# Patient Record
Sex: Male | Born: 1950 | Race: Black or African American | Hispanic: No | Marital: Married | Smoking: Never smoker
Health system: Southern US, Community
[De-identification: ages and names within clinical notes are randomized; demographics above are authoritative.]

## PROBLEM LIST (undated history)

## (undated) DIAGNOSIS — C801 Malignant (primary) neoplasm, unspecified: Secondary | ICD-10-CM

## (undated) DIAGNOSIS — K649 Unspecified hemorrhoids: Secondary | ICD-10-CM

## (undated) DIAGNOSIS — N201 Calculus of ureter: Secondary | ICD-10-CM

## (undated) DIAGNOSIS — I2510 Atherosclerotic heart disease of native coronary artery without angina pectoris: Secondary | ICD-10-CM

## (undated) DIAGNOSIS — K219 Gastro-esophageal reflux disease without esophagitis: Secondary | ICD-10-CM

## (undated) DIAGNOSIS — D649 Anemia, unspecified: Secondary | ICD-10-CM

## (undated) DIAGNOSIS — Z9861 Coronary angioplasty status: Secondary | ICD-10-CM

## (undated) DIAGNOSIS — R9439 Abnormal result of other cardiovascular function study: Secondary | ICD-10-CM

## (undated) DIAGNOSIS — E785 Hyperlipidemia, unspecified: Secondary | ICD-10-CM

## (undated) DIAGNOSIS — I10 Essential (primary) hypertension: Secondary | ICD-10-CM

## (undated) HISTORY — DX: Gastro-esophageal reflux disease without esophagitis: K21.9

## (undated) HISTORY — PX: CORONARY ANGIOPLASTY WITH STENT PLACEMENT: SHX49

## (undated) HISTORY — DX: Hypomagnesemia: E83.42

## (undated) HISTORY — DX: Coronary angioplasty status: Z98.61

## (undated) HISTORY — DX: Anemia, unspecified: D64.9

## (undated) HISTORY — DX: Calculus of ureter: N20.1

## (undated) HISTORY — DX: Unspecified hemorrhoids: K64.9

## (undated) HISTORY — DX: Abnormal result of other cardiovascular function study: R94.39

## (undated) HISTORY — DX: Hyperlipidemia, unspecified: E78.5

---

## 2012-09-29 DIAGNOSIS — Z9189 Other specified personal risk factors, not elsewhere classified: Secondary | ICD-10-CM | POA: Insufficient documentation

## 2012-09-29 DIAGNOSIS — Z9861 Coronary angioplasty status: Secondary | ICD-10-CM

## 2012-09-29 DIAGNOSIS — E785 Hyperlipidemia, unspecified: Secondary | ICD-10-CM

## 2012-09-29 DIAGNOSIS — R9430 Abnormal result of cardiovascular function study, unspecified: Secondary | ICD-10-CM | POA: Insufficient documentation

## 2012-09-29 HISTORY — DX: Coronary angioplasty status: Z98.61

## 2012-09-29 HISTORY — DX: Hyperlipidemia, unspecified: E78.5

## 2015-10-02 DIAGNOSIS — R7301 Impaired fasting glucose: Secondary | ICD-10-CM | POA: Insufficient documentation

## 2017-08-11 DIAGNOSIS — I10 Essential (primary) hypertension: Secondary | ICD-10-CM | POA: Insufficient documentation

## 2017-12-29 ENCOUNTER — Emergency Department: Payer: Medicare Other

## 2017-12-29 ENCOUNTER — Emergency Department
Admission: EM | Admit: 2017-12-29 | Discharge: 2017-12-29 | Disposition: A | Discharge status: Home / Self Care | Payer: Medicare Other | Attending: Emergency Medicine | Admitting: Emergency Medicine

## 2017-12-29 ENCOUNTER — Other Ambulatory Visit: Payer: Self-pay

## 2017-12-29 DIAGNOSIS — R1031 Right lower quadrant pain: Secondary | ICD-10-CM | POA: Insufficient documentation

## 2017-12-29 DIAGNOSIS — Z955 Presence of coronary angioplasty implant and graft: Secondary | ICD-10-CM | POA: Insufficient documentation

## 2017-12-29 DIAGNOSIS — N200 Calculus of kidney: Secondary | ICD-10-CM | POA: Diagnosis not present

## 2017-12-29 DIAGNOSIS — N2889 Other specified disorders of kidney and ureter: Secondary | ICD-10-CM | POA: Diagnosis not present

## 2017-12-29 DIAGNOSIS — I2510 Atherosclerotic heart disease of native coronary artery without angina pectoris: Secondary | ICD-10-CM | POA: Insufficient documentation

## 2017-12-29 DIAGNOSIS — R109 Unspecified abdominal pain: Secondary | ICD-10-CM | POA: Diagnosis present

## 2017-12-29 HISTORY — DX: Atherosclerotic heart disease of native coronary artery without angina pectoris: I25.10

## 2017-12-29 LAB — COMPREHENSIVE METABOLIC PANEL
ALK PHOS: 56 U/L (ref 38–126)
ALT: 25 U/L (ref 0–44)
ANION GAP: 8 (ref 5–15)
AST: 33 U/L (ref 15–41)
Albumin: 4.6 g/dL (ref 3.5–5.0)
BUN: 17 mg/dL (ref 8–23)
CALCIUM: 9.5 mg/dL (ref 8.9–10.3)
CO2: 26 mmol/L (ref 22–32)
CREATININE: 1.33 mg/dL — AB (ref 0.61–1.24)
Chloride: 106 mmol/L (ref 98–111)
GFR calc Af Amer: >60 mL/min (ref >60)
GFR, EST NON AFRICAN AMERICAN: 54 mL/min — AB (ref >60)
Glucose, Bld: 148 mg/dL — ABNORMAL HIGH (ref 70–99)
Potassium: 4.4 mmol/L (ref 3.5–5.1)
SODIUM: 140 mmol/L (ref 135–145)
TOTAL PROTEIN: 7.6 g/dL (ref 6.5–8.1)
Total Bilirubin: 1.1 mg/dL (ref 0.3–1.2)

## 2017-12-29 LAB — URINALYSIS, COMPLETE (UACMP) WITH MICROSCOPIC
BILIRUBIN URINE: NEGATIVE
Bacteria, UA: NONE SEEN
GLUCOSE, UA: NEGATIVE mg/dL
Ketones, ur: NEGATIVE mg/dL
LEUKOCYTES UA: NEGATIVE
NITRITE: NEGATIVE
PROTEIN: 30 mg/dL — AB
Specific Gravity, Urine: 1.017 (ref 1.005–1.030)
Squamous Epithelial / LPF: NONE SEEN (ref 0–5)
pH: 7 (ref 5.0–8.0)

## 2017-12-29 LAB — CBC
HCT: 40.7 % (ref 40.0–52.0)
HEMOGLOBIN: 13.6 g/dL (ref 13.0–18.0)
MCH: 30.3 pg (ref 26.0–34.0)
MCHC: 33.5 g/dL (ref 32.0–36.0)
MCV: 90.6 fL (ref 80.0–100.0)
PLATELETS: 229 10*3/uL (ref 150–440)
RBC: 4.49 MIL/uL (ref 4.40–5.90)
RDW: 13.9 % (ref 11.5–14.5)
WBC: 9.1 10*3/uL (ref 3.8–10.6)

## 2017-12-29 LAB — LIPASE, BLOOD: Lipase: 30 U/L (ref 11–51)

## 2017-12-29 MED ORDER — ONDANSETRON HCL 4 MG/2ML IJ SOLN
4.0000 mg | Freq: Once | INTRAMUSCULAR | Status: AC
  Administered 2017-12-29: 4 mg via INTRAVENOUS

## 2017-12-29 MED ORDER — SODIUM CHLORIDE 0.9 % IV BOLUS
1000.0000 mL | Freq: Once | INTRAVENOUS | Status: AC
  Administered 2017-12-29: 1000 mL via INTRAVENOUS

## 2017-12-29 MED ORDER — TAMSULOSIN HCL 0.4 MG PO CAPS: 0.4000 mg | ORAL_CAPSULE | Freq: Every day | ORAL | 0 refills | Status: DC

## 2017-12-29 MED ORDER — OXYCODONE-ACETAMINOPHEN 5-325 MG PO TABS: 1.0000 | ORAL_TABLET | ORAL | 0 refills | Status: DC | PRN

## 2017-12-29 MED ORDER — MORPHINE SULFATE (PF) 4 MG/ML IV SOLN
INTRAVENOUS | Status: AC
  Filled 2017-12-29: qty 1

## 2017-12-29 MED ORDER — ONDANSETRON HCL 4 MG/2ML IJ SOLN
INTRAMUSCULAR | Status: AC
  Filled 2017-12-29: qty 2

## 2017-12-29 MED ORDER — MORPHINE SULFATE (PF) 4 MG/ML IV SOLN
4.0000 mg | Freq: Once | INTRAVENOUS | Status: AC
  Administered 2017-12-29: 4 mg via INTRAVENOUS

## 2017-12-29 NOTE — ED Provider Notes (Signed)
[facility] Emergency Department Provider Note ____________________________________________   First MD Initiated Contact with Patient 12/29/17 1515     (approximate)  I have reviewed the triage vital signs and the nursing notes.   HISTORY  Chief Complaint Abdominal Pain  HPI Tanner Jackson is a 67 y.o. male history of coronary artery disease as well as kidney stones in the 90s who was presented to the emergency department with sudden onset right flank pain that started at about 10 AM.  He says the pain is sharp and a 10 out of 10.  No nausea or vomiting.  Says the pain is now constant.  No burning with urination or blood in his urine.  Says this feels similar to previous kidney stone.  Says that he feels the pain is somewhat radiating around to his right lower quadrant.  Past Medical History:  Diagnosis Date  . Coronary artery disease     There are no active problems to display for this patient.   Past Surgical History:  Procedure Laterality Date  . CORONARY ANGIOPLASTY WITH STENT PLACEMENT      Prior to Admission medications   Not on File    Allergies Patient has no known allergies.  No family history on file.  Social History Social History   Tobacco Use  . Smoking status: Never Smoker  . Smokeless tobacco: Never Used  Substance Use Topics  . Alcohol use: Yes    Comment: occ  . Drug use: Never    Review of Systems  Constitutional: No fever/chills Eyes: No visual changes. ENT: No sore throat. Cardiovascular: Denies chest pain. Respiratory: Denies shortness of breath. Gastrointestinal:   No nausea, no vomiting.  No diarrhea.  No constipation. Genitourinary: Negative for dysuria. Musculoskeletal: As above Skin: Negative for rash. Neurological: Negative for headaches, focal weakness or numbness.   ____________________________________________   PHYSICAL EXAM:  VITAL SIGNS: ED Triage Vitals  Enc Vitals Group     BP 12/29/17  1434 (!) 141/80     Pulse Rate 12/29/17 1434 70     Resp 12/29/17 1434 16     Temp 12/29/17 1434 98.4 F (36.9 C)     Temp Source 12/29/17 1434 Oral     SpO2 12/29/17 1434 98 %     Weight 12/29/17 1432 198 lb (89.8 kg)     Height 12/29/17 1432 5\' 6"  (1.676 m)     Head Circumference --      Peak Flow --      Pain Score 12/29/17 1431 8     Pain Loc --      Pain Edu? --      Excl. in [place]? --     Constitutional: Alert and oriented.  Patient appears uncomfortable Eyes: Conjunctivae are normal.  Head: Atraumatic. Nose: No congestion/rhinnorhea. Mouth/Throat: Mucous membranes are moist.  Neck: No stridor.   Cardiovascular: Normal rate, regular rhythm. Grossly normal heart sounds.   Respiratory: Normal respiratory effort.  No retractions. Lungs CTAB. Gastrointestinal: Soft and nontender. No distention.  Right-sided CVA tenderness to palpation. Musculoskeletal: No lower extremity tenderness nor edema.  No joint effusions. Neurologic:  Normal speech and language. No gross focal neurologic deficits are appreciated. Skin:  Skin is warm, dry and intact. No rash noted. Psychiatric: Mood and affect are normal. Speech and behavior are normal.  ____________________________________________   LABS (all labs ordered are listed, but only abnormal results are displayed)  Labs Reviewed  COMPREHENSIVE METABOLIC PANEL - Abnormal; Notable for the following  components:      Result Value   Glucose, Bld 148 (*)    Creatinine, Ser 1.33 (*)    GFR calc non Af Amer 54 (*)    All other components within normal limits  URINALYSIS, COMPLETE (UACMP) WITH MICROSCOPIC - Abnormal; Notable for the following components:   Color, Urine YELLOW (*)    APPearance CLOUDY (*)    Hgb urine dipstick LARGE (*)    Protein, ur 30 (*)    RBC / HPF >50 (*)    All other components within normal limits  LIPASE, BLOOD  CBC    ____________________________________________  EKG   ____________________________________________  RADIOLOGY  5 mm calculus in the proximal right ureter with resultant mild right hydronephrosis.  3.2 cm exophytic mixed density lesion arising from the midpole of the left kidney with areas of peripheral hemorrhage.  Recommend CT of renal protocol or MRI. ____________________________________________   PROCEDURES  Procedure(s) performed:   Procedures  Critical Care performed:   ____________________________________________   INITIAL IMPRESSION / ASSESSMENT AND PLAN / ED COURSE  Pertinent labs & imaging results that were available during my care of the patient were reviewed by me and considered in my medical decision making (see chart for details).  Differential diagnosis includes, but is not limited to, acute appendicitis, renal colic, testicular torsion, urinary tract infection/pyelonephritis, prostatitis,  epididymitis, diverticulitis, small bowel obstruction or ileus, colitis, abdominal aortic aneurysm, gastroenteritis, hernia, etc. As part of my medical decision making, I reviewed the following data within the [place] outpatient records  ----------------------------------------- 5:33 PM on 12/29/2017 -----------------------------------------  Patient at this time is pain-free.  We discussed the diagnosis of the right-sided kidney stone as well as a left-sided kidney mass the need for follow-up and further imaging.  The patient will be following up with urology.  I will give him the follow-up number for Dr. Bernardo Heater who is on-call.  We discussed that the mass will be very important to follow-up with as it is possible this could be a cancer.  As far as the kidney stone, the patient understands to drink plenty fluids.  He will be given Flomax as well as Percocet.  The stone is proximal and he understands return immediately for any worsening or concerning  symptoms especially abdominal pain, flank pain, fever or nausea and vomiting. ____________________________________________   FINAL CLINICAL IMPRESSION(S) / ED DIAGNOSES  Kidney stone.  Kidney mass.  NEW MEDICATIONS STARTED DURING THIS VISIT:  New Prescriptions   No medications on file     Note:  This document was prepared using Dragon voice recognition software and may include unintentional dictation errors.     Orbie Pyo, MD 12/29/17 [phone]

## 2017-12-29 NOTE — ED Notes (Signed)
Pt to the er for pain to the right side back and radiates around to the right side abdomen and into the midline. Pt reported increased pain on the ride over. Hx of kidney stone. Pt is pacing in room. Urine is dark in color. Wife is at bedside.

## 2017-12-29 NOTE — ED Notes (Signed)
Patient transported to CT 

## 2017-12-29 NOTE — ED Triage Notes (Signed)
Pt c/o abd pain in right lower back that radiates into right lower abd - pain has been present for 5 hours - denies N/V/D - denies pain/frequency/burning with urination

## 2018-01-05 ENCOUNTER — Encounter: Payer: Self-pay | Admitting: Urology

## 2018-01-05 ENCOUNTER — Ambulatory Visit: Payer: Medicare Other | Admitting: Urology

## 2018-01-05 VITALS — BP 159/95 | HR 80 | Ht 66.0 in | Wt 198.0 lb

## 2018-01-05 DIAGNOSIS — K649 Unspecified hemorrhoids: Secondary | ICD-10-CM

## 2018-01-05 DIAGNOSIS — N201 Calculus of ureter: Secondary | ICD-10-CM | POA: Diagnosis not present

## 2018-01-05 DIAGNOSIS — R9439 Abnormal result of other cardiovascular function study: Secondary | ICD-10-CM

## 2018-01-05 DIAGNOSIS — I2510 Atherosclerotic heart disease of native coronary artery without angina pectoris: Secondary | ICD-10-CM | POA: Insufficient documentation

## 2018-01-05 DIAGNOSIS — N2889 Other specified disorders of kidney and ureter: Secondary | ICD-10-CM | POA: Diagnosis not present

## 2018-01-05 DIAGNOSIS — Z9861 Coronary angioplasty status: Secondary | ICD-10-CM

## 2018-01-05 DIAGNOSIS — K219 Gastro-esophageal reflux disease without esophagitis: Secondary | ICD-10-CM

## 2018-01-05 DIAGNOSIS — D649 Anemia, unspecified: Secondary | ICD-10-CM

## 2018-01-05 DIAGNOSIS — Z9289 Personal history of other medical treatment: Secondary | ICD-10-CM | POA: Insufficient documentation

## 2018-01-05 HISTORY — DX: Coronary angioplasty status: Z98.61

## 2018-01-05 HISTORY — DX: Calculus of ureter: N20.1

## 2018-01-05 HISTORY — DX: Gastro-esophageal reflux disease without esophagitis: K21.9

## 2018-01-05 HISTORY — DX: Abnormal result of other cardiovascular function study: R94.39

## 2018-01-05 HISTORY — DX: Anemia, unspecified: D64.9

## 2018-01-05 HISTORY — DX: Hypomagnesemia: E83.42

## 2018-01-05 HISTORY — DX: Unspecified hemorrhoids: K64.9

## 2018-01-05 LAB — MICROSCOPIC EXAMINATION
EPITHELIAL CELLS (NON RENAL): NONE SEEN /HPF (ref 0–10)
RBC, UA: NONE SEEN /hpf (ref 0–2)

## 2018-01-05 LAB — URINALYSIS, COMPLETE
BILIRUBIN UA: NEGATIVE
Glucose, UA: NEGATIVE
Ketones, UA: NEGATIVE
Leukocytes, UA: NEGATIVE
Nitrite, UA: NEGATIVE
Protein, UA: NEGATIVE
RBC, UA: NEGATIVE
Specific Gravity, UA: 1.025 (ref 1.005–1.030)
UUROB: 0.2 mg/dL (ref 0.2–1.0)
pH, UA: 5 (ref 5.0–7.5)

## 2018-01-05 NOTE — Progress Notes (Signed)
01/05/2018 10:01 AM   Tanner Jackson [birth year] [id number]  Referring provider: Wilhelmina Mcardle, MD [address] [place], [place] [address]  Chief Complaint  Patient presents with  . Nephrolithiasis    New Patient  . Renal Mass    HPI: 67 year old male referred for further evaluation of a ureteral stone as well as possible renal mass.  He was seen and evaluated in the emergency room on 12/29/2017 with acute onset severe right flank pain.  His creatinine was mildly elevated to 1.33 (baseline 1.1) otherwise, no evidence of infection.    He underwent CT stone protocol showing a 5 mm right proximal ureteral calculus with mild hydronephrosis.  In addition to this, there is an incidental 3.2 exophytic mixed density renal lesion arising from the left midpole.  This is incompletely characterized due to lack of contrast.  No previous imaging for comparison.  He subsequently passed the stone just yesterday and brings the sample with him today.  Stone is consistent with size and shape of stone on CT scan.  He denies a personal history of kidney stones.  He denies any ongoing flank pain or gross hematuria.  No weight loss.  No family history of GU malignancies.    PMH: Past Medical History:  Diagnosis Date  . Abnormal nuclear stress test 01/05/2018   6/13:DRH:distal lad ischemia, ef normal  . Anemia 01/05/2018  . Coronary artery disease   . GERD (gastroesophageal reflux disease) 01/05/2018  . Hemorrhoids 01/05/2018  . History of PTCA 01/05/2018   6/13:DES to LAD.3.5 x 33mm Expedition. RCA dominant mild to moderate. Circ LM ok.  . Hyperlipidemia 09/29/2012  . Hypomagnesemia 01/05/2018  . Right ureteral stone 01/05/2018  . Status post percutaneous transluminal coronary angioplasty 09/29/2012   6/13:DES to LAD.3.5 x 44mm Expedition. RCA dominant mild to moderate. Circ LM ok.    Surgical History: Past Surgical History:  Procedure Laterality Date  . CORONARY ANGIOPLASTY WITH STENT  PLACEMENT      Home Medications:  Allergies as of 01/05/2018   No Known Allergies     Medication List        Accurate as of 01/05/18 10:01 AM. Always use your most recent med list.          aspirin EC 81 MG tablet Take by mouth.   atorvastatin 10 MG tablet Commonly known as:  LIPITOR Take 10 mg by mouth daily.   oxyCODONE-acetaminophen 5-325 MG tablet Commonly known as:  PERCOCET Take 1 tablet by mouth every 4 (four) hours as needed for moderate pain or severe pain.   tamsulosin 0.4 MG Caps capsule Commonly known as:  FLOMAX Take 1 capsule (0.4 mg total) by mouth daily.       Allergies: No Known Allergies  Family History: Family History  Problem Relation Age of Onset  . Kidney failure Mother     Social History:  reports that he has never smoked. He has never used smokeless tobacco. He reports that he drinks alcohol. He reports that he does not use drugs.  ROS: UROLOGY Frequent Urination?: No Hard to postpone urination?: No Burning/pain with urination?: No Get up at night to urinate?: No Leakage of urine?: No Urine stream starts and stops?: No Trouble starting stream?: No Do you have to strain to urinate?: No Blood in urine?: No Urinary tract infection?: No Sexually transmitted disease?: No Injury to kidneys or bladder?: No Painful intercourse?: No Weak stream?: No Erection problems?: No Penile pain?: No  Gastrointestinal Nausea?: No Vomiting?:  No Indigestion/heartburn?: No Diarrhea?: No Constipation?: No  Constitutional Fever: No Night sweats?: No Weight loss?: No Fatigue?: No  Skin Skin rash/lesions?: No Itching?: No  Eyes Blurred vision?: No Double vision?: No  Ears/Nose/Throat Sore throat?: No Sinus problems?: No  Hematologic/Lymphatic Swollen glands?: No Easy bruising?: No  Cardiovascular Leg swelling?: No Chest pain?: No  Respiratory Cough?: No Shortness of breath?: No  Endocrine Excessive thirst?:  No  Musculoskeletal Back pain?: No Joint pain?: No  Neurological Headaches?: No Dizziness?: No  Psychologic Depression?: No Anxiety?: No  Physical Exam: BP (!) 159/95   Pulse 80   Ht 5\' 6"  (1.676 m)   Wt 198 lb (89.8 kg)   BMI 31.96 kg/m   Constitutional:  Alert and oriented, No acute distress.  Accompanied by wife today. HEENT: [place] AT, moist mucus membranes.  Trachea midline, no masses. Cardiovascular: No clubbing, cyanosis, or edema. Respiratory: Normal respiratory effort, no increased work of breathing. GI: Abdomen is soft, nontender, nondistended, no abdominal masses GU: No CVA tenderness Skin: No rashes, bruises or suspicious lesions. Neurologic: Grossly intact, no focal deficits, moving all 4 extremities. Psychiatric: Normal mood and affect.  Laboratory Data: Lab Results  Component Value Date   WBC 9.1 12/29/2017   HGB 13.6 12/29/2017   HCT 40.7 12/29/2017   MCV 90.6 12/29/2017   PLT 229 12/29/2017    Lab Results  Component Value Date   CREATININE 1.33 (H) 12/29/2017     Urinalysis Results for orders placed or performed in visit on 01/05/18  Microscopic Examination  Result Value Ref Range   WBC, UA 0-5 0 - 5 /hpf   RBC, UA None seen 0 - 2 /hpf   Epithelial Cells (non renal) None seen 0 - 10 /hpf   Casts Present (A) None seen /lpf   Cast Type Hyaline casts N/A   Mucus, UA Present (A) Not Estab.   Bacteria, UA Few (A) None seen/Few  Urinalysis, Complete  Result Value Ref Range   Specific Gravity, UA 1.025 1.005 - 1.030   pH, UA 5.0 5.0 - 7.5   Color, UA Yellow Yellow   Appearance Ur Clear Clear   Leukocytes, UA Negative Negative   Protein, UA Negative Negative/Trace   Glucose, UA Negative Negative   Ketones, UA Negative Negative   RBC, UA Negative Negative   Bilirubin, UA Negative Negative   Urobilinogen, Ur 0.2 0.2 - 1.0 mg/dL   Nitrite, UA Negative Negative   Microscopic Examination See below:     Pertinent Imaging:. Results for orders  placed during the hospital encounter of 12/29/17  CT RENAL STONE STUDY   Narrative CLINICAL DATA:  Right flank pain.  EXAM: CT ABDOMEN AND PELVIS WITHOUT CONTRAST  TECHNIQUE: Multidetector CT imaging of the abdomen and pelvis was performed following the standard protocol without IV contrast.  COMPARISON:  None.  FINDINGS: Lower chest: No acute abnormality.  Hepatobiliary: No focal liver abnormality. Calcified granuloma in the central liver consistent with prior granulomatous disease. The gallbladder is unremarkable. No biliary dilatation.  Pancreas: Unremarkable. No pancreatic ductal dilatation or surrounding inflammatory changes.  Spleen: Normal in size without focal abnormality.  Adrenals/Urinary Tract: The adrenal glands are unremarkable. Punctate right renal calculi. 5 mm calculus in the proximal right ureter with resultant mild right hydronephrosis. 3.2 cm exophytic mixed density lesion arising from the midpole of the left kidney. There are areas of peripheral hyperdensity suggesting hemorrhage or proteinaceous contents. The bladder is unremarkable for the degree of distention.  Stomach/Bowel: Stomach is  within normal limits. Tiny appendicoliths. Appendix otherwise appears normal. No evidence of bowel wall thickening, distention, or inflammatory changes.  Vascular/Lymphatic: Minimal aortoiliac atherosclerosis. No enlarged abdominal or pelvic lymph nodes.  Reproductive: Prostatomegaly.  Other: No abdominal wall hernia or abnormality. No abdominopelvic ascites. No pneumoperitoneum.  Musculoskeletal: No acute or significant osseous findings.  IMPRESSION: 1. 5 mm calculus in the proximal right ureter with resultant mild right hydronephrosis. 2. Additional punctate nonobstructive right nephrolithiasis. 3. 3.2 cm exophytic mixed density lesion arising from the midpole of the left kidney with areas of peripheral hemorrhage. Non-emergent renal protocol CT or MRI with  and without contrast is recommended for further evaluation. This recommendation follows ACR consensus guidelines: Management of the Incidental Renal Mass on CT: A White Paper of the ACR Incidental Findings Committee. J Am Coll Radiol [phone].   Electronically Signed   By: Titus Dubin M.D.   On: 12/29/2017 16:25    CT scan personally reviewed today and with the patient and his wife.     Assessment & Plan:    1. Right ureteral stone S/p interval passage with expulsive therapy Stone stent today for stone analysis- will call with results We discussed general stone prevention techniques including drinking plenty water with goal of producing 2.5 L urine daily, increased citric acid intake, avoidance of high oxalate containing foods, and decreased salt intake.  Information about dietary recommendations given today.  - Urinalysis, Complete  2. Renal mass, left Incidental probable left renal mass on noncontrast scan Lesion is incompletely characterized I have recommended pursuing renal MRI with and without contrast for further evaluation ddx discussed - MR Abdomen W Wo Contrast; Future   Return in about 1 month (around 02/02/2018) for MRI.  Hollice Espy, MD  [facility] [address] [place], [place] [address] [phone]  I spent 45 min with this patient of which greater than 50% was spent in counseling and coordination of care with the patient.

## 2018-01-11 ENCOUNTER — Other Ambulatory Visit: Payer: Self-pay | Admitting: Urology

## 2018-01-28 ENCOUNTER — Ambulatory Visit
Admission: RE | Admit: 2018-01-28 | Discharge: 2018-01-28 | Disposition: A | Discharge status: Home / Self Care | Payer: Medicare Other | Source: Ambulatory Visit | Attending: Urology | Admitting: Urology

## 2018-01-28 DIAGNOSIS — N201 Calculus of ureter: Secondary | ICD-10-CM

## 2018-01-28 DIAGNOSIS — Z87442 Personal history of urinary calculi: Secondary | ICD-10-CM | POA: Diagnosis not present

## 2018-01-28 DIAGNOSIS — N2889 Other specified disorders of kidney and ureter: Secondary | ICD-10-CM | POA: Diagnosis not present

## 2018-01-28 MED ORDER — GADOBENATE DIMEGLUMINE 529 MG/ML IV SOLN
20.0000 mL | Freq: Once | INTRAVENOUS | Status: AC | PRN
  Administered 2018-01-28: 18 mL via INTRAVENOUS

## 2018-02-04 ENCOUNTER — Encounter: Payer: Self-pay | Admitting: Urology

## 2018-02-04 ENCOUNTER — Ambulatory Visit: Payer: Medicare Other | Admitting: Urology

## 2018-02-04 VITALS — BP 160/90 | HR 79 | Ht 66.0 in | Wt 209.8 lb

## 2018-02-04 DIAGNOSIS — N2889 Other specified disorders of kidney and ureter: Secondary | ICD-10-CM | POA: Diagnosis not present

## 2018-02-04 NOTE — Progress Notes (Signed)
02/04/2018 2:26 PM   Albertha Ghee [birth year] [id number]  Referring provider: Wilhelmina Mcardle, MD [address] [place], [place] [address]  Chief Complaint  Patient presents with  . Follow-up    MRI results    HPI: 67 year old male initially referred for an obstructing ureteral calculus with interval spontaneous passage of the stone found to have an incidental 3.2 cm left midpole renal lesion which was incompletely characterized on CT stone protocol.  He returns today with follow-up MRI.  MRI performed on 01/28/2018 shows a 3 cm hemorrhagic left renal lesion in the left midpole, minimally exophytic with internal contrast enhancement.  This lesion is highly suspicious for renal cell carcinoma.  On the mid posterior pole of left kidney, there is another 1.4 cm indeterminate lesion, representing either hemorrhagic cyst or papillary renal cell carcinoma.  There is no evidence of lymphadenopathy or vascular invasion.  No evidence of metastatic disease.  No previous abdominal surgeries.  No weight loss.  No family history of renal cell carcinoma.  No flank pain or gross hematuria.    PMH: Past Medical History:  Diagnosis Date  . Abnormal nuclear stress test 01/05/2018   6/13:DRH:distal lad ischemia, ef normal  . Anemia 01/05/2018  . Coronary artery disease   . GERD (gastroesophageal reflux disease) 01/05/2018  . Hemorrhoids 01/05/2018  . History of PTCA 01/05/2018   6/13:DES to LAD.3.5 x 13mm Expedition. RCA dominant mild to moderate. Circ LM ok.  . Hyperlipidemia 09/29/2012  . Hypomagnesemia 01/05/2018  . Right ureteral stone 01/05/2018  . Status post percutaneous transluminal coronary angioplasty 09/29/2012   6/13:DES to LAD.3.5 x 24mm Expedition. RCA dominant mild to moderate. Circ LM ok.    Surgical History: Past Surgical History:  Procedure Laterality Date  . CORONARY ANGIOPLASTY WITH STENT PLACEMENT      Home Medications:  Allergies as of 02/04/2018   No Known  Allergies     Medication List        Accurate as of 02/04/18  2:26 PM. Always use your most recent med list.          aspirin EC 81 MG tablet Take by mouth.   atorvastatin 10 MG tablet Commonly known as:  LIPITOR Take 10 mg by mouth daily.   tadalafil 5 MG tablet Commonly known as:  CIALIS TAKE 1 TABLET (5 MG TOTAL) BY MOUTH DAILY AS NEEDED.       Allergies: No Known Allergies  Family History: Family History  Problem Relation Age of Onset  . Kidney failure Mother     Social History:  reports that he has never smoked. He has never used smokeless tobacco. He reports that he drinks alcohol. He reports that he does not use drugs.  ROS: UROLOGY Frequent Urination?: No Hard to postpone urination?: No Burning/pain with urination?: No Get up at night to urinate?: No Leakage of urine?: No Urine stream starts and stops?: No Trouble starting stream?: No Do you have to strain to urinate?: No Blood in urine?: No Urinary tract infection?: No Sexually transmitted disease?: No Injury to kidneys or bladder?: No Painful intercourse?: No Weak stream?: No Erection problems?: No Penile pain?: No  Gastrointestinal Nausea?: No Vomiting?: No Indigestion/heartburn?: No Diarrhea?: No Constipation?: No  Constitutional Fever: No Night sweats?: No Weight loss?: No Fatigue?: No  Skin Skin rash/lesions?: No Itching?: No  Eyes Blurred vision?: No Double vision?: No  Ears/Nose/Throat Sore throat?: No Sinus problems?: No  Hematologic/Lymphatic Swollen glands?: No Easy bruising?: No  Cardiovascular  Leg swelling?: No Chest pain?: No  Respiratory Cough?: No Shortness of breath?: No  Endocrine Excessive thirst?: No  Musculoskeletal Back pain?: No Joint pain?: No  Neurological Headaches?: No Dizziness?: No  Psychologic Depression?: No Anxiety?: No  Physical Exam: BP (!) 160/90 (BP Location: Left Arm, Patient Position: Sitting, Cuff Size: Normal)    Pulse 79   Ht 5\' 6"  (1.676 m)   Wt 209 lb 12.8 oz (95.2 kg)   BMI 33.86 kg/m   Constitutional:  Alert and oriented, No acute distress.  Accompanied by wife today. HEENT:  AT, moist mucus membranes.  Trachea midline, no masses. Cardiovascular: No clubbing, cyanosis, or edema. Respiratory: Normal respiratory effort, no increased work of breathing. GI: Abdomen is soft, nontender, nondistended, no abdominal masses.  No appreciable scars or hernias. GU: No CVA tenderness. Skin: No rashes, bruises or suspicious lesions. Neurologic: Grossly intact, no focal deficits, moving all 4 extremities. Psychiatric: Normal mood and affect.  Laboratory Data: Lab Results  Component Value Date   WBC 9.1 12/29/2017   HGB 13.6 12/29/2017   HCT 40.7 12/29/2017   MCV 90.6 12/29/2017   PLT 229 12/29/2017    Lab Results  Component Value Date   CREATININE 1.33 (H) 12/29/2017    Urinalysis    Component Value Date/Time   COLORURINE YELLOW (A) 12/29/2017 1541   APPEARANCEUR Clear 01/05/2018 0915   LABSPEC 1.017 12/29/2017 1541   PHURINE 7.0 12/29/2017 1541   GLUCOSEU Negative 01/05/2018 0915   HGBUR LARGE (A) 12/29/2017 1541   BILIRUBINUR Negative 01/05/2018 0915   KETONESUR NEGATIVE 12/29/2017 1541   PROTEINUR Negative 01/05/2018 0915   PROTEINUR 30 (A) 12/29/2017 1541   NITRITE Negative 01/05/2018 0915   NITRITE NEGATIVE 12/29/2017 1541   LEUKOCYTESUR Negative 01/05/2018 0915    Lab Results  Component Value Date   LABMICR See below: 01/05/2018   WBCUA 0-5 01/05/2018   RBCUA None seen 01/05/2018   LABEPIT None seen 01/05/2018   MUCUS Present (A) 01/05/2018   BACTERIA Few (A) 01/05/2018    Pertinent Imaging: CLINICAL DATA:  Indeterminate hemorrhagic left renal lesion on recent noncontrast CT.  EXAM: MRI ABDOMEN WITHOUT AND WITH CONTRAST  TECHNIQUE: Multiplanar multisequence MR imaging of the abdomen was performed both before and after the administration of intravenous  contrast.  CONTRAST:  90mL MULTIHANCE GADOBENATE DIMEGLUMINE 529 MG/ML IV SOLN  COMPARISON:  CT on 12/29/2017  FINDINGS: Lower Chest: No acute findings.  Hepatobiliary: No hepatic masses identified. Gallbladder is unremarkable. No evidence of biliary ductal dilatation.  Pancreas: No mass or inflammatory changes. No evidence of pancreatic ductal dilatation.  Spleen: Within normal limits in size and appearance.  Adrenals/Urinary Tract: Normal appearance of adrenal glands and right kidney. A subcapsular lesion is seen in the anterior midpole of the left kidney which contains blood products. This measures 3.0 x 2.9 cm on image 35/15. Dynamic images show some internal contrast enhancement and mural soft tissue nodularity along the posterior wall. This is suspicious for a hemorrhagic renal cell carcinoma.  Another 1.4 cm lesion is seen in the posterior midpole the left kidney which shows minimal T1 hyperintensity, T2 hypointensity. Questionable mild contrast enhancement of this lesion on subtraction images. This lesion could represent a benign hemorrhagic cyst although a small papillary renal cell carcinoma cannot be excluded.  Stomach/Bowel: No evidence of obstruction, inflammatory process or abnormal fluid collections.  Vascular/Lymphatic: No pathologically enlarged lymph nodes. No abdominal aortic aneurysm.  Other:  None.  Musculoskeletal:  No suspicious bone lesions identified.  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  Another 1.4 cm indeterminate lesion in left kidney, with differential diagnosis including a hemorrhagic cyst or papillary renal cell carcinoma.  No evidence of abdominal metastatic disease.   Electronically Signed   By: Earle Gell M.D.   On: 01/28/2018 15:01  MRI imaging was personally reviewed today and with the  patient.  Agree with radiologic interpretation.  Assessment & Plan:    1. 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     There is a second lesion which is indeterminate posteriorly, recommend observation for this lesion for the time being and possible percutaneous cryo down the road if it becomes more concerning or gross.  A solid renal mass raises the suspicion of primary renal malignancy.  We discussed this in detail and in regards to the spectrum of renal masses which includes cysts (pure cysts are considered benign), solid masses and everything in between. The risk of metastasis increases as the size of solid renal mass increases. In general, it is believed that the risk of metastasis for renal masses less than 3-4 cm is small (up to approximately 5%) based mainly on large retrospective studies.  In some cases and especially in patients of older age and multiple comorbidities a surveillance approach may be appropriate. The treatment of solid renal masses includes: surveillance, cryoablation (percutaneous and laparoscopic) in addition to partial and complete nephrectomy (each with option of laparoscopic, robotic and open depending on appropriateness). Furthermore, nephrectomy appears to be an independent risk factor for the development of chronic kidney disease suggesting that nephron sparing approaches should be implored whenever feasible. We reviewed these options in context of the patients current situation as well as the pros and cons of each.  The larger 3 cm the stent does appear to be amenable to partial nephrectomy.  He is interested in robotic partial nephrectomy as primary treatment we discussed the risk of surgery including risk of bleeding, infection, damage surrounding structures, urine leak, AV malformation, heart attack, stroke, conversion to open procedure, or conversion to radical nephrectomy.  All  questions were answered today.  Postoperative course and restrictions were also discussed in detail.  Hollice Espy, MD  [facility] [address], [place], [place] [address] [phone]  I spent 25  min with this patient of which greater than 50% was spent in counseling and coordination of care with the patient.

## 2018-02-11 ENCOUNTER — Other Ambulatory Visit: Payer: Self-pay | Admitting: Radiology

## 2018-02-11 ENCOUNTER — Ambulatory Visit: Payer: Medicare Other | Admitting: Urology

## 2018-02-11 DIAGNOSIS — N2889 Other specified disorders of kidney and ureter: Secondary | ICD-10-CM

## 2018-02-17 ENCOUNTER — Telehealth: Payer: Self-pay | Admitting: Radiology

## 2018-02-17 NOTE — Telephone Encounter (Signed)
This patient extremely high risk of bleeding during surgery.  As such, aspirin therapy is absolutely contraindicated for at least 5 days prior to surgery at a minimum.   There is a relatively high risk of delayed bleeding which could be exacerbated by antiplatelet therapy.  I generally would feel more comfortable holding this for a week postop as well.  Please forward this message back to his cardiologist.  If further optimization or evaluation needs to be performed, please do so.   Hollice Espy, MD

## 2018-02-17 NOTE — Telephone Encounter (Signed)
Faxed Dr Cherrie Gauze note below to Virgina Evener, patient's cardiologist.

## 2018-02-17 NOTE — Telephone Encounter (Signed)
Cardiology clearance recommendation as follows.  2. Preoperative risk assessment I am able to easily elicit a greater than 4 MET work capacity without chest pain or shortness of breath. Even though prior stenting was 6 years ago with residual disease, there is nothing to suggest current ischemic symptoms and thus no further cardiovascular testing is required.  Procedure planned: Laparoscopic partial left nephrectomy, moderate risk procedure  Cardiovascular risk: Based upon revised cardiovascular risk index/Goldman criteria Tanner Jackson is a class I risk for 3.9% 30-day risk for adverse cardiovascular events.  Recommendations: He should continue low-dose aspirin perioperatively unless absolutely contraindicated by urologic surgeon. Discontinuation of aspirin will increase slightly perioperative cardiovascular events. If aspirin is discontinued, it needs to be resume within 24-48 hours postoperatively. He will continue atorvastatin perioperatively. Surgery currently scheduled for September 30. We have enough time window to initiate low-dose beta-blocker and I have thus prescribed Toprol-XL 25 mg to be taken in the evening. I cautioned Tanner Jackson that it could potentially make him feel fatigued though I do not suspect it will adversely affect heart rates or blood pressures. If it does make him feel fatigued, he will contact our office and we will discontinue the medication. If he does tolerate the Toprol-XL he will continue it for 2-4 weeks postoperatively.  Okay to proceed with robot-assisted laparoscopic left partial nephrectomy with risk and recommendations as stated above.  I personally performed the service, incident to. (I2)  [place], PA

## 2018-02-22 NOTE — Telephone Encounter (Signed)
Advised patient to stop aspirin 81mg  beginning on 03/10/2018 & resume medication on 03/22/2018 per clearance obtained from patient's cardiologist. Questions answered. Patient voices understanding.

## 2018-03-08 ENCOUNTER — Encounter
Admission: RE | Admit: 2018-03-08 | Discharge: 2018-03-08 | Disposition: A | Discharge status: Home / Self Care | Payer: Medicare Other | Source: Ambulatory Visit | Attending: Urology | Admitting: Urology

## 2018-03-08 ENCOUNTER — Other Ambulatory Visit: Payer: Self-pay

## 2018-03-08 DIAGNOSIS — I2510 Atherosclerotic heart disease of native coronary artery without angina pectoris: Secondary | ICD-10-CM | POA: Insufficient documentation

## 2018-03-08 DIAGNOSIS — K219 Gastro-esophageal reflux disease without esophagitis: Secondary | ICD-10-CM | POA: Insufficient documentation

## 2018-03-08 DIAGNOSIS — D649 Anemia, unspecified: Secondary | ICD-10-CM | POA: Diagnosis not present

## 2018-03-08 DIAGNOSIS — Z9861 Coronary angioplasty status: Secondary | ICD-10-CM | POA: Insufficient documentation

## 2018-03-08 DIAGNOSIS — Z01812 Encounter for preprocedural laboratory examination: Secondary | ICD-10-CM | POA: Insufficient documentation

## 2018-03-08 DIAGNOSIS — E785 Hyperlipidemia, unspecified: Secondary | ICD-10-CM | POA: Diagnosis not present

## 2018-03-08 LAB — CBC
HCT: 38.4 % — ABNORMAL LOW (ref 40.0–52.0)
HEMOGLOBIN: 13.1 g/dL (ref 13.0–18.0)
MCH: 31 pg (ref 26.0–34.0)
MCHC: 34.3 g/dL (ref 32.0–36.0)
MCV: 90.5 fL (ref 80.0–100.0)
PLATELETS: 213 10*3/uL (ref 150–440)
RBC: 4.24 MIL/uL — AB (ref 4.40–5.90)
RDW: 14 % (ref 11.5–14.5)
WBC: 5.5 10*3/uL (ref 3.8–10.6)

## 2018-03-08 LAB — URINALYSIS, ROUTINE W REFLEX MICROSCOPIC
Bilirubin Urine: NEGATIVE
Glucose, UA: NEGATIVE mg/dL
Hgb urine dipstick: NEGATIVE
Ketones, ur: NEGATIVE mg/dL
Leukocytes, UA: NEGATIVE
Nitrite: NEGATIVE
PH: 5 (ref 5.0–8.0)
Protein, ur: NEGATIVE mg/dL
SPECIFIC GRAVITY, URINE: 1.017 (ref 1.005–1.030)

## 2018-03-08 LAB — BASIC METABOLIC PANEL
ANION GAP: 6 (ref 5–15)
BUN: 14 mg/dL (ref 8–23)
CALCIUM: 9 mg/dL (ref 8.9–10.3)
CO2: 28 mmol/L (ref 22–32)
CREATININE: 1.14 mg/dL (ref 0.61–1.24)
Chloride: 107 mmol/L (ref 98–111)
Glucose, Bld: 134 mg/dL — ABNORMAL HIGH (ref 70–99)
Potassium: 4.1 mmol/L (ref 3.5–5.1)
SODIUM: 141 mmol/L (ref 135–145)

## 2018-03-08 LAB — PROTIME-INR
INR: 0.95
Prothrombin Time: 12.6 seconds (ref 11.4–15.2)

## 2018-03-08 NOTE — Patient Instructions (Signed)
Your procedure is scheduled on: March 15, 2108 Monday Report to Day Surgery on the 2nd floor of the [facility]. To find out your arrival time, please call [phone] between 1PM - 3PM on: Friday March 12, 2108  REMEMBER: Instructions that are not followed completely may result in serious medical risk, up to and including death; or upon the discretion of your surgeon and anesthesiologist your surgery may need to be rescheduled.  Do not eat food after midnight the night before surgery.  No gum chewing, lozengers or hard candies.  You may however, drink CLEAR liquids up to 2 hours before you are scheduled to arrive for your surgery. Do not drink anything within 2 hours of the start of your surgery.  Clear liquids include: - water  - apple juice without pulp - gatorade - black coffee or tea (Do NOT add milk or creamers to the coffee or tea) Do NOT drink anything that is not on this list.  Type 1 and Type 2 diabetics should only drink water.  No Alcohol for 24 hours before or after surgery.  No Smoking including e-cigarettes for 24 hours prior to surgery.  No chewable tobacco products for at least 6 hours prior to surgery.  No nicotine patches on the day of surgery.  On the morning of surgery brush your teeth with toothpaste and water, you may rinse your mouth with mouthwash if you wish. Do not swallow any toothpaste or mouthwash.  Notify your doctor if there is any change in your medical condition (cold, fever, infection).  Do not wear jewelry, make-up, hairpins, clips or nail polish.  Do not wear lotions, powders, or perfumes. You may wear deodorant.  Do not shave 48 hours prior to surgery. Men may shave face and neck.  Contacts and dentures may not be worn into surgery.  Do not bring valuables to the hospital, including drivers license, insurance or credit cards.  [facility] is not responsible for any belongings or valuables.   TAKE THESE MEDICATIONS THE  MORNING OF SURGERY: NONE  Use CHG Soap  as directed on instruction sheet.   Follow recommendations from Cardiologist, Pulmonologist or PCP regarding stopping Aspirin- STOP ASPIRIN 03-10-2108.  Stop Anti-inflammatories (NSAIDS) such as Advil, Aleve, Ibuprofen, Motrin, Naproxen, Naprosyn and Aspirin based products such as Excedrin, Goodys Powder, BC Powder. (May take Tylenol or Acetaminophen if needed.)  Stop ANY OVER THE COUNTER supplements until after surgery. (May continue Vitamin D, Vitamin B, and multivitamin.)  Wear comfortable clothing (specific to your surgery type) to the hospital.  Plan for stool softeners for home use.  If you are being admitted to the hospital overnight, leave your suitcase in the car. After surgery it may be brought to your room.  If you are being discharged the day of surgery, you will not be allowed to drive home. You will need a responsible adult to drive you home and stay with you that night.   If you are taking public transportation, you will need to have a responsible adult with you. Please confirm with your physician that it is acceptable to use public transportation.   Please call [phone] if you have any questions about these instructions.

## 2018-03-09 LAB — URINE CULTURE: Culture: NO GROWTH

## 2018-03-14 MED ORDER — CEFAZOLIN SODIUM-DEXTROSE 2-4 GM/100ML-% IV SOLN
2.0000 g | INTRAVENOUS | Status: AC
  Administered 2018-03-15: 2 g via INTRAVENOUS

## 2018-03-15 ENCOUNTER — Inpatient Hospital Stay: Payer: Medicare Other | Admitting: Anesthesiology

## 2018-03-15 ENCOUNTER — Encounter: Payer: Self-pay | Admitting: Emergency Medicine

## 2018-03-15 ENCOUNTER — Encounter
Admission: RE | Disposition: A | Discharge status: Home / Self Care | Payer: Self-pay | Source: Ambulatory Visit | Attending: Urology

## 2018-03-15 ENCOUNTER — Inpatient Hospital Stay
Admission: RE | Admit: 2018-03-15 | Discharge: 2018-03-17 | DRG: 658 | Disposition: A | Discharge status: Home / Self Care | Payer: Medicare Other | Source: Ambulatory Visit | Attending: Urology | Admitting: Urology

## 2018-03-15 ENCOUNTER — Other Ambulatory Visit: Payer: Self-pay

## 2018-03-15 DIAGNOSIS — K219 Gastro-esophageal reflux disease without esophagitis: Secondary | ICD-10-CM | POA: Diagnosis present

## 2018-03-15 DIAGNOSIS — I2510 Atherosclerotic heart disease of native coronary artery without angina pectoris: Secondary | ICD-10-CM | POA: Diagnosis present

## 2018-03-15 DIAGNOSIS — E785 Hyperlipidemia, unspecified: Secondary | ICD-10-CM | POA: Diagnosis present

## 2018-03-15 DIAGNOSIS — D4102 Neoplasm of uncertain behavior of left kidney: Secondary | ICD-10-CM | POA: Diagnosis not present

## 2018-03-15 DIAGNOSIS — Z79899 Other long term (current) drug therapy: Secondary | ICD-10-CM | POA: Diagnosis not present

## 2018-03-15 DIAGNOSIS — I10 Essential (primary) hypertension: Secondary | ICD-10-CM | POA: Diagnosis present

## 2018-03-15 DIAGNOSIS — N2889 Other specified disorders of kidney and ureter: Secondary | ICD-10-CM

## 2018-03-15 DIAGNOSIS — C642 Malignant neoplasm of left kidney, except renal pelvis: Principal | ICD-10-CM | POA: Diagnosis present

## 2018-03-15 DIAGNOSIS — N201 Calculus of ureter: Secondary | ICD-10-CM

## 2018-03-15 DIAGNOSIS — Z7982 Long term (current) use of aspirin: Secondary | ICD-10-CM | POA: Diagnosis not present

## 2018-03-15 DIAGNOSIS — Z955 Presence of coronary angioplasty implant and graft: Secondary | ICD-10-CM | POA: Diagnosis not present

## 2018-03-15 DIAGNOSIS — D630 Anemia in neoplastic disease: Secondary | ICD-10-CM | POA: Diagnosis present

## 2018-03-15 HISTORY — PX: ROBOTIC ASSITED PARTIAL NEPHRECTOMY: SHX6087

## 2018-03-15 HISTORY — PX: OPERATIVE ULTRASOUND: SHX5996

## 2018-03-15 LAB — ABO/RH: ABO/RH(D): O POS

## 2018-03-15 SURGERY — ROBOTIC ASSITED PARTIAL NEPHRECTOMY
Anesthesia: General

## 2018-03-15 MED ORDER — OXYBUTYNIN CHLORIDE 5 MG PO TABS: 5.0000 mg | ORAL_TABLET | Freq: Three times a day (TID) | ORAL | Status: DC | PRN

## 2018-03-15 MED ORDER — ROCURONIUM BROMIDE 100 MG/10ML IV SOLN
INTRAVENOUS | Status: DC | PRN
  Administered 2018-03-15: 10 mg via INTRAVENOUS
  Administered 2018-03-15: 40 mg via INTRAVENOUS
  Administered 2018-03-15: 50 mg via INTRAVENOUS

## 2018-03-15 MED ORDER — SUGAMMADEX SODIUM 200 MG/2ML IV SOLN
INTRAVENOUS | Status: DC | PRN
  Administered 2018-03-15: 200 mg via INTRAVENOUS

## 2018-03-15 MED ORDER — GLYCOPYRROLATE 0.2 MG/ML IJ SOLN
INTRAMUSCULAR | Status: AC
  Filled 2018-03-15: qty 1

## 2018-03-15 MED ORDER — SUCCINYLCHOLINE CHLORIDE 20 MG/ML IJ SOLN
INTRAMUSCULAR | Status: AC
  Filled 2018-03-15: qty 1

## 2018-03-15 MED ORDER — PHENYLEPHRINE HCL 10 MG/ML IJ SOLN
INTRAMUSCULAR | Status: DC | PRN
  Administered 2018-03-15 (×3): 100 ug via INTRAVENOUS

## 2018-03-15 MED ORDER — DIPHENHYDRAMINE HCL 50 MG/ML IJ SOLN: 12.5000 mg | Freq: Four times a day (QID) | INTRAMUSCULAR | Status: DC | PRN

## 2018-03-15 MED ORDER — FUROSEMIDE 10 MG/ML IJ SOLN
INTRAMUSCULAR | Status: AC
  Filled 2018-03-15: qty 4

## 2018-03-15 MED ORDER — NAPHAZOLINE-GLYCERIN 0.012-0.2 % OP SOLN
1.0000 [drp] | Freq: Four times a day (QID) | OPHTHALMIC | Status: DC | PRN
  Filled 2018-03-15: qty 15

## 2018-03-15 MED ORDER — DEXAMETHASONE SODIUM PHOSPHATE 10 MG/ML IJ SOLN
INTRAMUSCULAR | Status: AC
  Filled 2018-03-15: qty 1

## 2018-03-15 MED ORDER — BUPIVACAINE HCL 0.5 % IJ SOLN
INTRAMUSCULAR | Status: DC | PRN
  Administered 2018-03-15: 20 mL

## 2018-03-15 MED ORDER — FENTANYL CITRATE (PF) 100 MCG/2ML IJ SOLN
INTRAMUSCULAR | Status: DC | PRN
  Administered 2018-03-15 (×4): 50 ug via INTRAVENOUS

## 2018-03-15 MED ORDER — FENTANYL CITRATE (PF) 100 MCG/2ML IJ SOLN
25.0000 ug | INTRAMUSCULAR | Status: DC | PRN
  Administered 2018-03-15 (×4): 25 ug via INTRAVENOUS

## 2018-03-15 MED ORDER — DIPHENHYDRAMINE HCL 12.5 MG/5ML PO ELIX
12.5000 mg | ORAL_SOLUTION | Freq: Four times a day (QID) | ORAL | Status: DC | PRN
  Filled 2018-03-15: qty 5

## 2018-03-15 MED ORDER — FAMOTIDINE 20 MG PO TABS
ORAL_TABLET | ORAL | Status: AC
  Administered 2018-03-15: 20 mg via ORAL
  Filled 2018-03-15: qty 1

## 2018-03-15 MED ORDER — BUPIVACAINE HCL (PF) 0.5 % IJ SOLN
INTRAMUSCULAR | Status: AC
  Filled 2018-03-15: qty 30

## 2018-03-15 MED ORDER — MANNITOL 25 % IV SOLN
INTRAVENOUS | Status: AC
  Filled 2018-03-15: qty 100

## 2018-03-15 MED ORDER — DEXAMETHASONE SODIUM PHOSPHATE 10 MG/ML IJ SOLN
INTRAMUSCULAR | Status: DC | PRN
  Administered 2018-03-15: 10 mg via INTRAVENOUS

## 2018-03-15 MED ORDER — FUROSEMIDE 10 MG/ML IJ SOLN
INTRAMUSCULAR | Status: DC | PRN
  Administered 2018-03-15: 10 mg via INTRAVENOUS

## 2018-03-15 MED ORDER — SODIUM CHLORIDE FLUSH 0.9 % IV SOLN
INTRAVENOUS | Status: AC
  Filled 2018-03-15: qty 10

## 2018-03-15 MED ORDER — PROPOFOL 10 MG/ML IV BOLUS
INTRAVENOUS | Status: AC
  Filled 2018-03-15: qty 20

## 2018-03-15 MED ORDER — LIDOCAINE HCL (PF) 2 % IJ SOLN
INTRAMUSCULAR | Status: AC
  Filled 2018-03-15: qty 10

## 2018-03-15 MED ORDER — LACTATED RINGERS IV SOLN
INTRAVENOUS | Status: DC
  Administered 2018-03-15 (×2): via INTRAVENOUS

## 2018-03-15 MED ORDER — DOCUSATE SODIUM 100 MG PO CAPS
100.0000 mg | ORAL_CAPSULE | Freq: Two times a day (BID) | ORAL | Status: DC
  Administered 2018-03-15 – 2018-03-17 (×5): 100 mg via ORAL
  Filled 2018-03-15 (×5): qty 1

## 2018-03-15 MED ORDER — MANNITOL 25 % IV SOLN
INTRAVENOUS | Status: DC | PRN
  Administered 2018-03-15 (×2): 12.5 g via INTRAVENOUS

## 2018-03-15 MED ORDER — ONDANSETRON HCL 4 MG/2ML IJ SOLN
INTRAMUSCULAR | Status: AC
  Filled 2018-03-15: qty 2

## 2018-03-15 MED ORDER — ONDANSETRON HCL 4 MG/2ML IJ SOLN: 4.0000 mg | Freq: Once | INTRAMUSCULAR | Status: DC | PRN

## 2018-03-15 MED ORDER — ATORVASTATIN CALCIUM 10 MG PO TABS
10.0000 mg | ORAL_TABLET | Freq: Every evening | ORAL | Status: DC
  Administered 2018-03-15 – 2018-03-16 (×2): 10 mg via ORAL
  Filled 2018-03-15 (×2): qty 1

## 2018-03-15 MED ORDER — CEFAZOLIN SODIUM-DEXTROSE 1-4 GM/50ML-% IV SOLN
1.0000 g | Freq: Three times a day (TID) | INTRAVENOUS | Status: AC
  Administered 2018-03-15 (×2): 1 g via INTRAVENOUS
  Filled 2018-03-15 (×2): qty 50

## 2018-03-15 MED ORDER — OXYCODONE-ACETAMINOPHEN 5-325 MG PO TABS
1.0000 | ORAL_TABLET | ORAL | Status: DC | PRN
  Administered 2018-03-15 – 2018-03-16 (×4): 2 via ORAL
  Filled 2018-03-15 (×4): qty 2

## 2018-03-15 MED ORDER — SUCCINYLCHOLINE CHLORIDE 20 MG/ML IJ SOLN
INTRAMUSCULAR | Status: DC | PRN
  Administered 2018-03-15: 120 mg via INTRAVENOUS

## 2018-03-15 MED ORDER — LACTATED RINGERS IV SOLN
INTRAVENOUS | Status: DC | PRN
  Administered 2018-03-15 (×2): via INTRAVENOUS

## 2018-03-15 MED ORDER — PHENYLEPHRINE HCL 10 MG/ML IJ SOLN
INTRAMUSCULAR | Status: AC
  Filled 2018-03-15: qty 1

## 2018-03-15 MED ORDER — ACETAMINOPHEN 10 MG/ML IV SOLN
INTRAVENOUS | Status: AC
  Filled 2018-03-15: qty 100

## 2018-03-15 MED ORDER — FAMOTIDINE 20 MG PO TABS
20.0000 mg | ORAL_TABLET | Freq: Once | ORAL | Status: AC
  Administered 2018-03-15: 20 mg via ORAL

## 2018-03-15 MED ORDER — EPHEDRINE SULFATE 50 MG/ML IJ SOLN
INTRAMUSCULAR | Status: DC | PRN
  Administered 2018-03-15: 10 mg via INTRAVENOUS

## 2018-03-15 MED ORDER — FENTANYL CITRATE (PF) 250 MCG/5ML IJ SOLN
INTRAMUSCULAR | Status: AC
  Filled 2018-03-15: qty 5

## 2018-03-15 MED ORDER — BELLADONNA ALKALOIDS-OPIUM 16.2-60 MG RE SUPP: 1.0000 | Freq: Four times a day (QID) | RECTAL | Status: DC | PRN

## 2018-03-15 MED ORDER — PROPOFOL 10 MG/ML IV BOLUS
INTRAVENOUS | Status: DC | PRN
  Administered 2018-03-15: 160 mg via INTRAVENOUS

## 2018-03-15 MED ORDER — ROCURONIUM BROMIDE 50 MG/5ML IV SOLN
INTRAVENOUS | Status: AC
  Filled 2018-03-15: qty 2

## 2018-03-15 MED ORDER — ONDANSETRON HCL 4 MG/2ML IJ SOLN
INTRAMUSCULAR | Status: DC | PRN
  Administered 2018-03-15: 4 mg via INTRAVENOUS

## 2018-03-15 MED ORDER — MIDAZOLAM HCL 2 MG/2ML IJ SOLN
INTRAMUSCULAR | Status: DC | PRN
  Administered 2018-03-15: 2 mg via INTRAVENOUS

## 2018-03-15 MED ORDER — ACETAMINOPHEN 325 MG PO TABS
650.0000 mg | ORAL_TABLET | ORAL | Status: DC | PRN
  Administered 2018-03-16 – 2018-03-17 (×3): 650 mg via ORAL
  Filled 2018-03-15 (×3): qty 2

## 2018-03-15 MED ORDER — SUGAMMADEX SODIUM 200 MG/2ML IV SOLN
INTRAVENOUS | Status: AC
  Filled 2018-03-15: qty 2

## 2018-03-15 MED ORDER — EPHEDRINE SULFATE 50 MG/ML IJ SOLN
INTRAMUSCULAR | Status: AC
  Filled 2018-03-15: qty 1

## 2018-03-15 MED ORDER — METOPROLOL SUCCINATE ER 25 MG PO TB24
25.0000 mg | ORAL_TABLET | Freq: Every evening | ORAL | Status: DC
  Administered 2018-03-15 – 2018-03-16 (×2): 25 mg via ORAL
  Filled 2018-03-15 (×2): qty 1

## 2018-03-15 MED ORDER — MIDAZOLAM HCL 2 MG/2ML IJ SOLN
INTRAMUSCULAR | Status: AC
  Filled 2018-03-15: qty 2

## 2018-03-15 MED ORDER — MORPHINE SULFATE (PF) 2 MG/ML IV SOLN: 2.0000 mg | INTRAVENOUS | Status: DC | PRN

## 2018-03-15 MED ORDER — FENTANYL CITRATE (PF) 100 MCG/2ML IJ SOLN
INTRAMUSCULAR | Status: AC
  Administered 2018-03-15: 25 ug via INTRAVENOUS
  Filled 2018-03-15: qty 2

## 2018-03-15 MED ORDER — THROMBIN 5000 UNITS EX SOLR
CUTANEOUS | Status: DC | PRN
  Administered 2018-03-15: 5000 [IU] via TOPICAL

## 2018-03-15 MED ORDER — ONDANSETRON HCL 4 MG/2ML IJ SOLN: 4.0000 mg | INTRAMUSCULAR | Status: DC | PRN

## 2018-03-15 MED ORDER — GLYCOPYRROLATE 0.2 MG/ML IJ SOLN
INTRAMUSCULAR | Status: DC | PRN
  Administered 2018-03-15: 0.2 mg via INTRAVENOUS

## 2018-03-15 MED ORDER — EVICEL 5 ML EX KIT
PACK | CUTANEOUS | Status: DC | PRN
  Administered 2018-03-15: 5 mL

## 2018-03-15 MED ORDER — ACETAMINOPHEN 10 MG/ML IV SOLN
INTRAVENOUS | Status: DC | PRN
  Administered 2018-03-15: 1000 mg via INTRAVENOUS

## 2018-03-15 MED ORDER — LIDOCAINE HCL (CARDIAC) PF 100 MG/5ML IV SOSY
PREFILLED_SYRINGE | INTRAVENOUS | Status: DC | PRN
  Administered 2018-03-15: 100 mg via INTRAVENOUS

## 2018-03-15 MED ORDER — THROMBIN 5000 UNITS EX SOLR
CUTANEOUS | Status: AC
  Filled 2018-03-15: qty 5000

## 2018-03-15 MED ORDER — SODIUM CHLORIDE 0.9 % IV SOLN
INTRAVENOUS | Status: DC
  Administered 2018-03-15: 14:00:00 via INTRAVENOUS

## 2018-03-15 MED ORDER — CEFAZOLIN SODIUM-DEXTROSE 2-4 GM/100ML-% IV SOLN
INTRAVENOUS | Status: AC
  Filled 2018-03-15: qty 100

## 2018-03-15 SURGICAL SUPPLY — 94 items
ANCHOR TIS RET SYS 235ML (MISCELLANEOUS) ×4 IMPLANT
APPLICATOR SURGIFLO ENDO (HEMOSTASIS) ×4 IMPLANT
BNDG COHESIVE 4X5 TAN STRL (GAUZE/BANDAGES/DRESSINGS) IMPLANT
BULB RESERV EVAC DRAIN JP 100C (MISCELLANEOUS) ×4 IMPLANT
CANISTER SUCT 1200ML W/VALVE (MISCELLANEOUS) ×4 IMPLANT
CHLORAPREP W/TINT 26ML (MISCELLANEOUS) ×4 IMPLANT
CLIP SUT LAPRA TY ABSORB (SUTURE) ×24 IMPLANT
CLIP VESOLOCK LG 6/CT PURPLE (CLIP) ×24 IMPLANT
CORD BIP STRL DISP 12FT (MISCELLANEOUS) ×4 IMPLANT
COVER TIP SHEARS 8 DVNC (MISCELLANEOUS) ×2 IMPLANT
COVER TIP SHEARS 8MM DA VINCI (MISCELLANEOUS) ×2
DEFOGGER SCOPE WARMER CLEARIFY (MISCELLANEOUS) ×4 IMPLANT
DERMABOND ADVANCED (GAUZE/BANDAGES/DRESSINGS) ×4
DERMABOND ADVANCED .7 DNX12 (GAUZE/BANDAGES/DRESSINGS) ×4 IMPLANT
DRAIN CHANNEL JP 15F RND 16 (MISCELLANEOUS) ×4 IMPLANT
DRAIN CHANNEL JP 19F (MISCELLANEOUS) IMPLANT
DRAPE SHEET LG 3/4 BI-LAMINATE (DRAPES) ×8 IMPLANT
DRAPE SURG 17X11 SM STRL (DRAPES) ×16 IMPLANT
DRIVER LRG NEEDLE DA VINCI (INSTRUMENTS) ×4
DRIVER NDLE LRG DVNC (INSTRUMENTS) ×4 IMPLANT
ELECT REM PT RETURN 9FT ADLT (ELECTROSURGICAL) ×4
ELECTRODE REM PT RTRN 9FT ADLT (ELECTROSURGICAL) ×2 IMPLANT
GLOVE BIO SURGEON STRL SZ 6.5 (GLOVE) ×9 IMPLANT
GLOVE BIO SURGEONS STRL SZ 6.5 (GLOVE) ×3
GLOVE BIOGEL PI IND STRL 6.5 (GLOVE) ×4 IMPLANT
GLOVE BIOGEL PI IND STRL 7.5 (GLOVE) ×2 IMPLANT
GLOVE BIOGEL PI INDICATOR 6.5 (GLOVE) ×4
GLOVE BIOGEL PI INDICATOR 7.5 (GLOVE) ×2
GOWN STRL REUS W/ TWL LRG LVL3 (GOWN DISPOSABLE) ×12 IMPLANT
GOWN STRL REUS W/TWL LRG LVL3 (GOWN DISPOSABLE) ×12
GRASPER DBL FENESTRATED (INSTRUMENTS) ×2
GRASPER DBL FENESTRATED DVNC (INSTRUMENTS) ×2 IMPLANT
GRASPER SUT TROCAR 14GX15 (MISCELLANEOUS) ×4 IMPLANT
HEMOSTAT SURGICEL 2X14 (HEMOSTASIS) ×4 IMPLANT
IRRIGATION STRYKERFLOW (MISCELLANEOUS) ×2 IMPLANT
IRRIGATOR STRYKERFLOW (MISCELLANEOUS) ×4
IV NS 1000ML (IV SOLUTION) ×2
IV NS 1000ML BAXH (IV SOLUTION) ×2 IMPLANT
KIT ACCESSORY DA VINCI DISP (KITS) ×2
KIT ACCESSORY DVNC DISP (KITS) ×2 IMPLANT
KIT PINK PAD W/HEAD ARE REST (MISCELLANEOUS) ×4
KIT PINK PAD W/HEAD ARM REST (MISCELLANEOUS) ×2 IMPLANT
KIT TURNOVER KIT A (KITS) ×4 IMPLANT
KITTNER LAPARASCOPIC 5X40 (MISCELLANEOUS) ×4 IMPLANT
LABEL OR SOLS (LABEL) ×4 IMPLANT
LOOP RED MAXI  1X406MM (MISCELLANEOUS) ×2
LOOP VESSEL MAXI 1X406 RED (MISCELLANEOUS) ×2 IMPLANT
NEEDLE HYPO 25X1 1.5 SAFETY (NEEDLE) ×4 IMPLANT
NEEDLE INSUFFLATION 14GA 120MM (NEEDLE) ×4 IMPLANT
NS IRRIG 500ML POUR BTL (IV SOLUTION) ×4 IMPLANT
PACK LAP CHOLECYSTECTOMY (MISCELLANEOUS) ×4 IMPLANT
PAPER ECG REC THERMAL (MISCELLANEOUS) ×4 IMPLANT
PENCIL ELECTRO HAND CTR (MISCELLANEOUS) ×4 IMPLANT
PORT ACCESS TROCAR AIRSEAL 12 (TROCAR) ×2 IMPLANT
PORT ACCESS TROCAR AIRSEAL 5M (TROCAR) ×2
PROBE ULTRASOUND PROART (MISCELLANEOUS) ×4 IMPLANT
PROGRASP ENDOWRIST DA VINCI (INSTRUMENTS) ×2
PROGRASP ENDOWRIST DVNC (INSTRUMENTS) ×2 IMPLANT
SET TRI-LUMEN FLTR TB AIRSEAL (TUBING) ×4 IMPLANT
SLEEVE ENDOPATH XCEL 5M (ENDOMECHANICALS) ×4 IMPLANT
SLEEVE PROTECTION STRL DISP (MISCELLANEOUS) ×4 IMPLANT
SOLUTION ELECTROLUBE (MISCELLANEOUS) ×4 IMPLANT
SPOGE SURGIFLO 8M (HEMOSTASIS) ×2
SPONGE LAP 4X18 RFD (DISPOSABLE) ×4 IMPLANT
SPONGE SURGIFLO 8M (HEMOSTASIS) ×2 IMPLANT
SPONGE VERSALON 4X4 4PLY (MISCELLANEOUS) ×4 IMPLANT
STAPLE RELOAD 2.5MM WHITE (STAPLE) IMPLANT
STAPLER SKIN PROX 35W (STAPLE) ×4 IMPLANT
STAPLER VASCULAR ECHELON 35 (CUTTER) IMPLANT
STRAP SAFETY 5IN WIDE (MISCELLANEOUS) ×12 IMPLANT
SUT DVC VLOC 90 3-0 CV23 UNDY (SUTURE) ×20 IMPLANT
SUT ETHILON 3-0 FS-10 30 BLK (SUTURE) ×4
SUT MNCRL AB 4-0 PS2 18 (SUTURE) ×8 IMPLANT
SUT PDS PLUS 0 (SUTURE)
SUT PDS PLUS AB 0 CT-2 (SUTURE) IMPLANT
SUT PROLENE 4 0 RB 1 (SUTURE) ×2
SUT PROLENE 4-0 RB1 .5 CRCL 36 (SUTURE) ×2 IMPLANT
SUT VIC AB 0 CT1 36 (SUTURE) ×8 IMPLANT
SUT VIC AB 2-0 SH 27 (SUTURE) ×10
SUT VIC AB 2-0 SH 27XBRD (SUTURE) ×10 IMPLANT
SUT VICRYL 0 AB UR-6 (SUTURE) ×16 IMPLANT
SUT VICRYL PLUS ABS 0 54 (SUTURE) ×4 IMPLANT
SUTURE EHLN 3-0 FS-10 30 BLK (SUTURE) ×2 IMPLANT
TAPE CLOTH 10X20 WHT NS LF (TAPE) ×4 IMPLANT
TAPE CLOTH 2X10 WHT NS LF (TAPE) ×4
TIP RIGID 35CM EVICEL (HEMOSTASIS) ×4 IMPLANT
TRAY FOLEY MTR SLVR 16FR STAT (SET/KITS/TRAYS/PACK) ×4 IMPLANT
TROCAR 12M 150ML BLUNT (TROCAR) ×4 IMPLANT
TROCAR DISP BLADELESS 8 DVNC (TROCAR) ×2 IMPLANT
TROCAR DISP BLADELESS 8MM (TROCAR) ×2
TROCAR ENDOPATH XCEL 12X100 BL (ENDOMECHANICALS) ×4 IMPLANT
TROCAR XCEL 12X100 BLDLESS (ENDOMECHANICALS) ×4 IMPLANT
TROCAR XCEL NON-BLD 5MMX100MML (ENDOMECHANICALS) ×4 IMPLANT
TUBING INSUFFLATION (TUBING) ×4 IMPLANT

## 2018-03-15 NOTE — Anesthesia Procedure Notes (Signed)
Procedure Name: Intubation Date/Time: 03/15/2018 8:23 AM Performed by: Aline Brochure, CRNA Pre-anesthesia Checklist: Patient identified, Emergency Drugs available, Suction available and Patient being monitored Patient Re-evaluated:Patient Re-evaluated prior to induction Oxygen Delivery Method: Circle system utilized Preoxygenation: Pre-oxygenation with 100% oxygen Induction Type: IV induction Ventilation: Oral airway inserted - appropriate to patient size and Mask ventilation without difficulty Laryngoscope Size: McGraph and 4 Grade View: Grade I Tube type: Oral Tube size: 7.5 mm Number of attempts: 2 Airway Equipment and Method: Stylet and Video-laryngoscopy Placement Confirmation: ETT inserted through vocal cords under direct vision,  positive ETCO2 and breath sounds checked- equal and bilateral Secured at: 24 cm Tube secured with: Tape Dental Injury: Teeth and Oropharynx as per pre-operative assessment  Difficulty Due To: Difficult Airway- due to anterior larynx

## 2018-03-15 NOTE — Anesthesia Preprocedure Evaluation (Addendum)
Anesthesia Evaluation  Patient identified by MRN, date of birth, ID band Patient awake    Reviewed: Allergy & Precautions, NPO status , Patient's Chart, lab work & pertinent test results, reviewed documented beta blocker date and time   Airway Mallampati: II  TM Distance: >3 FB     Dental  (+) Teeth Intact   Pulmonary neg pulmonary ROS,    Pulmonary exam normal        Cardiovascular hypertension, Pt. on medications and Pt. on home beta blockers + CAD  Normal cardiovascular exam     Neuro/Psych negative neurological ROS  negative psych ROS   GI/Hepatic Neg liver ROS, GERD  Medicated,  Endo/Other  negative endocrine ROS  Renal/GU negative Renal ROS     Musculoskeletal negative musculoskeletal ROS (+)   Abdominal Normal abdominal exam  (+)   Peds negative pediatric ROS (+)  Hematology  (+) anemia ,   Anesthesia Other Findings   Reproductive/Obstetrics                             Anesthesia Physical Anesthesia Plan  ASA: III  Anesthesia Plan: General   Post-op Pain Management:    Induction: Intravenous  PONV Risk Score and Plan:   Airway Management Planned: Oral ETT  Additional Equipment: Arterial line  Intra-op Plan:   Post-operative Plan: Extubation in OR  Informed Consent: I have reviewed the patients History and Physical, chart, labs and discussed the procedure including the risks, benefits and alternatives for the proposed anesthesia with the patient or authorized representative who has indicated his/her understanding and acceptance.   Dental advisory given  Plan Discussed with: CRNA and Surgeon  Anesthesia Plan Comments:         Anesthesia Quick Evaluation

## 2018-03-15 NOTE — Anesthesia Post-op Follow-up Note (Signed)
Anesthesia QCDR form completed.        

## 2018-03-15 NOTE — Op Note (Signed)
PREOPERATIVE DIAGNOSIS: Left renal mass    POSTOPERATIVE DIAGNOSIS: Left renal mass    OPERATION PERFORMED: 1. Robotic assisted laparoscopic left partial nephrectomy 2. Intraoperative ultrasound with interpretation.  SURGEON: Hollice Espy, MD.   Assistant surgeon: Nickolas Madrid, MD  FINDINGS: Hilar anatomy: 1 arteries. 1 veins. 3.2 cm solid renal mass  Warm ischemia time: 14 min.  SPECIMENS: 1. Left renal mass  BLOOD LOSS: 150 cc  ANESTHESIA: General.  COMPLICATIONS: None.  DRAINS: 16-French Foley catheter. 15 Round JP  INDICATIONS FOR OPERATION: Left renal mass  DESCRIPTION OF OPERATION: Informed consent was obtained. The patient was marked on the left side and then taken to the operating room, placed supine on the operating table. General anesthesia was provided. SCDs were provided for DVT prophylaxis and IV antibiotics for bacterial prophylaxis. Foley catheter was placed to drain the bladder. OG tube was placed by anesthesia. The patient was then positioned in right lateral decubitus position with the left flank elevated about 70 degrees. All pressure points were carefully padded. The table was flexed slightly. The left arm was placed in a padded support. The patient was secured to the table with soft chest, hip, and lower extremity straps. The patient was prepped and draped in usual sterile fashion. We had a time-out confirming the patient identification, the planned procedure, the surgical site, and all present were in agreement.  A Veress needle was placed just lateral to the left rectus belly at the level of the umbilicus. Aspiration, irrigation, and saline drop test confirmed good position. Opening pressure was less than 9 mmHg. The abdomen was insufflated to 15 mmHg. Following this, trocar sites were mapped out with two robotic trocars triangulating the expected location of the tumor, a 12 mm trocar between, at about the midclavicular line,  for the camera, 12 mm midline trocars and a 5 mm for the assistant, and an 43mm trocar in the left lower quadrant for the 4th arm.  I used a visual obturator to place a 5 mm trocar at one of the robotic port sites, and the peritoneal cavity was surveyed with no abnormalities or injuries. Remaining trocars were placed under laparoscopic visualization. I used a Minette Headland device to place 0 Vicryl sutures at the 12 mm trocar sites for closure at the end of the case. The robot was docked. I placed monopolar shears in the right arm, [place] bipolar in the left, and a double fenestrated grasper in the 4th arm.   The white line of Toldt was incised and the right colon was reflected. The tail of Gerota's fascia was lifted up and the ureter and gonadal vein were identified. The gonadal vein was left medial and the ureter was lifted up. The psoas muscle was identified under the ureter and I followed this plane towards the renal hilum. The hilar vessels were identified and exposed circumferentially.   At this point, I incised Gerota's fascia and defatted the kidney to expose the mass located anterior upper pole.  The spleen was mobilized and packed superiorly using a sponge to create some space between the 2 structures.  Once the tumor was exposed, intraoperative ultrasound was performed. The  mass was imaged and measured, and then excision markings were made. 12.5 gm of mannitol and 10 mg IV Lasix were given, and then bulldog clamps were placed on the renal artery x 2. Vein was NOT clamped. Warm ischemia time was clocked. The renal mass was sharply excised and immediately placed in a 10 mm endocatch bag. The  first layer of the renorrhaphy was closed with running V-Loc suture, anchored at each end with a Lapra-Ty. A sliding clip renorrhaphy was performed to close the outer layer, using interrupted 2-0 vicryl suture, Weck clips, and Lapra-Ty's. The capsule edges were nicely approximated. The  bulldog clamps were removed. Warm ischemia time was 14 minutes. Additional 12.5 gm of mannitol was given. All needles were extracted. The kidney was well perfused and the renorrhaphy was hemostatic. Gerota's was reapproximated using a running 2-0 vicryl suture.  I did apply FloSeal within the bed of the nephrectomy site as well as Evaseal overlying the partial nephrectomy bed for further hemostasis.  Gerota's was closed using a barbed suture and Weck clips.  Floseal was placed around the hilum. Hemostasis was achieved.  A 15 round JP drain was placed lateral to the kidney. Trocars were removed under direct vision. The tumors were extracted and sent for pathology.  Notably, the mass was intact upon placing it in the bag but upon extraction, there was iatrogenic capsular rupture laterally with a specimen inside of the bag.  This is not a true tumor spillage.  The base of the lesion itself was inspected and noted to be intact. At the 12 mm trocar sites, the 0 Vicryl sutures that had been placed at the start of the case were tied down securely. The extraction site was closed with running #0 vicryl sutures. All the wounds were copiously irrigated and then infiltrated with local anesthetic. The skin edges were re approximated with 4-0 Monocryl. Dermabond was applied.   All sponge, needle, and instrument counts were reported correct. The patient was awakened from anesthesia and transferred to recovery in stable condition. There were no complications and the patient tolerated the procedure well. Operative events were discussed with the patient's family.  I did discuss the iatrogenic tumor rupture with pathology by telephone and oriented them to the true margin.  Hollice Espy, MD

## 2018-03-15 NOTE — H&P (Signed)
02/04/2018  --> updated 03/15/2018, no changes RRR CTAB  Tanner Jackson 1950/09/04 [id number]  Referring provider: Wilhelmina Mcardle, MD [address] [place], [place] [address]      Chief Complaint  Patient presents with  . Follow-up    MRI results    HPI: 67 year old male initially referred for an obstructing ureteral calculus with interval spontaneous passage of the stone found to have an incidental 3.2 cm left midpole renal lesion which was incompletely characterized on CT stone protocol.  He returns today with follow-up MRI.  MRI performed on 01/28/2018 shows a 3 cm hemorrhagic left renal lesion in the left midpole, minimally exophytic with internal contrast enhancement.  This lesion is highly suspicious for renal cell carcinoma.  On the mid posterior pole of left kidney, there is another 1.4 cm indeterminate lesion, representing either hemorrhagic cyst or papillary renal cell carcinoma.  There is no evidence of lymphadenopathy or vascular invasion.  No evidence of metastatic disease.  No previous abdominal surgeries.  No weight loss.  No family history of renal cell carcinoma.  No flank pain or gross hematuria.    PMH:     Past Medical History:  Diagnosis Date  . Abnormal nuclear stress test 01/05/2018   6/13:DRH:distal lad ischemia, ef normal  . Anemia 01/05/2018  . Coronary artery disease   . GERD (gastroesophageal reflux disease) 01/05/2018  . Hemorrhoids 01/05/2018  . History of PTCA 01/05/2018   6/13:DES to LAD.3.5 x 77mm Expedition. RCA dominant mild to moderate. Circ LM ok.  . Hyperlipidemia 09/29/2012  . Hypomagnesemia 01/05/2018  . Right ureteral stone 01/05/2018  . Status post percutaneous transluminal coronary angioplasty 09/29/2012   6/13:DES to LAD.3.5 x 67mm Expedition. RCA dominant mild to moderate. Circ LM ok.    Surgical History:      Past Surgical History:  Procedure Laterality Date  . CORONARY ANGIOPLASTY WITH STENT PLACEMENT        Home Medications:  Allergies as of 02/04/2018   No Known Allergies              Medication List            Accurate as of 02/04/18  2:26 PM. Always use your most recent med list.           aspirin EC 81 MG tablet Take by mouth.   atorvastatin 10 MG tablet Commonly known as:  LIPITOR Take 10 mg by mouth daily.   tadalafil 5 MG tablet Commonly known as:  CIALIS TAKE 1 TABLET (5 MG TOTAL) BY MOUTH DAILY AS NEEDED.       Allergies: No Known Allergies  Family History:      Family History  Problem Relation Age of Onset  . Kidney failure Mother     Social History:  reports that he has never smoked. He has never used smokeless tobacco. He reports that he drinks alcohol. He reports that he does not use drugs.  ROS: UROLOGY Frequent Urination?: No Hard to postpone urination?: No Burning/pain with urination?: No Get up at night to urinate?: No Leakage of urine?: No Urine stream starts and stops?: No Trouble starting stream?: No Do you have to strain to urinate?: No Blood in urine?: No Urinary tract infection?: No Sexually transmitted disease?: No Injury to kidneys or bladder?: No Painful intercourse?: No Weak stream?: No Erection problems?: No Penile pain?: No  Gastrointestinal Nausea?: No Vomiting?: No Indigestion/heartburn?: No Diarrhea?: No Constipation?: No  Constitutional Fever: No Night sweats?: No  Weight loss?: No Fatigue?: No  Skin Skin rash/lesions?: No Itching?: No  Eyes Blurred vision?: No Double vision?: No  Ears/Nose/Throat Sore throat?: No Sinus problems?: No  Hematologic/Lymphatic Swollen glands?: No Easy bruising?: No  Cardiovascular Leg swelling?: No Chest pain?: No  Respiratory Cough?: No Shortness of breath?: No  Endocrine Excessive thirst?: No  Musculoskeletal Back pain?: No Joint pain?: No  Neurological Headaches?: No Dizziness?: No  Psychologic Depression?:  No Anxiety?: No  Physical Exam: BP (!) 160/90 (BP Location: Left Arm, Patient Position: Sitting, Cuff Size: Normal)   Pulse 79   Ht 5\' 6"  (1.676 m)   Wt 209 lb 12.8 oz (95.2 kg)   BMI 33.86 kg/m   Constitutional:  Alert and oriented, No acute distress.  Accompanied by wife today. HEENT: [place] AT, moist mucus membranes.  Trachea midline, no masses. Cardiovascular: No clubbing, cyanosis, or edema. Respiratory: Normal respiratory effort, no increased work of breathing. GI: Abdomen is soft, nontender, nondistended, no abdominal masses.  No appreciable scars or hernias. GU: No CVA tenderness. Skin: No rashes, bruises or suspicious lesions. Neurologic: Grossly intact, no focal deficits, moving all 4 extremities. Psychiatric: Normal mood and affect.  Laboratory Data: RecentLabs  Lab Results  Component Value Date   WBC 9.1 12/29/2017   HGB 13.6 12/29/2017   HCT 40.7 12/29/2017   MCV 90.6 12/29/2017   PLT 229 12/29/2017      RecentLabs       Lab Results  Component Value Date   CREATININE 1.33 (H) 12/29/2017      Urinalysis Labs(Brief)          Component Value Date/Time   COLORURINE YELLOW (A) 12/29/2017 1541   APPEARANCEUR Clear 01/05/2018 0915   LABSPEC 1.017 12/29/2017 1541   PHURINE 7.0 12/29/2017 1541   GLUCOSEU Negative 01/05/2018 0915   HGBUR LARGE (A) 12/29/2017 1541   BILIRUBINUR Negative 01/05/2018 0915   KETONESUR NEGATIVE 12/29/2017 1541   PROTEINUR Negative 01/05/2018 0915   PROTEINUR 30 (A) 12/29/2017 1541   NITRITE Negative 01/05/2018 0915   NITRITE NEGATIVE 12/29/2017 1541   LEUKOCYTESUR Negative 01/05/2018 0915      RecentLabs       Lab Results  Component Value Date   LABMICR See below: 01/05/2018   WBCUA 0-5 01/05/2018   RBCUA None seen 01/05/2018   LABEPIT None seen 01/05/2018   MUCUS Present (A) 01/05/2018   BACTERIA Few (A) 01/05/2018      Pertinent Imaging: CLINICAL DATA: Indeterminate  hemorrhagic left renal lesion on recent noncontrast CT.  EXAM: MRI ABDOMEN WITHOUT AND WITH CONTRAST  TECHNIQUE: Multiplanar multisequence MR imaging of the abdomen was performed both before and after the administration of intravenous contrast.  CONTRAST: 62mL MULTIHANCE GADOBENATE DIMEGLUMINE 529 MG/ML IV SOLN  COMPARISON: CT on 12/29/2017  FINDINGS: Lower Chest: No acute findings.  Hepatobiliary: No hepatic masses identified. Gallbladder is unremarkable. No evidence of biliary ductal dilatation.  Pancreas: No mass or inflammatory changes. No evidence of pancreatic ductal dilatation.  Spleen: Within normal limits in size and appearance.  Adrenals/Urinary Tract: Normal appearance of adrenal glands and right kidney. A subcapsular lesion is seen in the anterior midpole of the left kidney which contains blood products. This measures 3.0 x 2.9 cm on image 35/15. Dynamic images show some internal contrast enhancement and mural soft tissue nodularity along the posterior wall. This is suspicious for a hemorrhagic renal cell carcinoma.  Another 1.4 cm lesion is seen in the posterior midpole the left kidney which shows minimal T1  hyperintensity, T2 hypointensity. Questionable mild contrast enhancement of this lesion on subtraction images. This lesion could represent a benign hemorrhagic cyst although a small papillary renal cell carcinoma cannot be excluded.  Stomach/Bowel: No evidence of obstruction, inflammatory process or abnormal fluid collections.  Vascular/Lymphatic: No pathologically enlarged lymph nodes. No abdominal aortic aneurysm.  Other: None.  Musculoskeletal: No suspicious bone lesions identified.  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  Another 1.4 cm indeterminate lesion in left kidney, with differential diagnosis  including a hemorrhagic cyst or papillary renal cell carcinoma.  No evidence of abdominal metastatic disease.   Electronically Signed By: Earle Gell M.D. On: 01/28/2018 15:01  MRI imaging was personally reviewed today and with the patient.  Agree with radiologic interpretation.  Assessment & Plan:    1. 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     There is a second lesion which is indeterminate posteriorly, recommend observation for this lesion for the time being and possible percutaneous cryo down the road if it becomes more concerning or gross.  A solid renal mass raises the suspicion of primary renal malignancy.  We discussed this in detail and in regards to the spectrum of renal masses which includes cysts (pure cysts are considered benign), solid masses and everything in between. The risk of metastasis increases as the size of solid renal mass increases. In general, it is believed that the risk of metastasis for renal masses less than 3-4 cm is small (up to approximately 5%) based mainly on large retrospective studies.  In some cases and especially in patients of older age and multiple comorbidities a surveillance approach may be appropriate. The treatment of solid renal masses includes: surveillance, cryoablation (percutaneous and laparoscopic) in addition to partial and complete nephrectomy (each with option of laparoscopic, robotic and open depending on appropriateness). Furthermore, nephrectomy appears to be an independent risk factor for the development of chronic kidney disease suggesting that nephron sparing approaches should be implored whenever feasible. We reviewed these options in context of the patients current situation as well as the pros and cons of each.  The larger 3 cm the stent does appear to be amenable to partial nephrectomy.  He is interested in robotic partial  nephrectomy as primary treatment we discussed the risk of surgery including risk of bleeding, infection, damage surrounding structures, urine leak, AV malformation, heart attack, stroke, conversion to open procedure, or conversion to radical nephrectomy.  All questions were answered today.  Postoperative course and restrictions were also discussed in detail.  Hollice Espy, MD  [facility] [address] [place], [place] [address] [phone]  I spent 25  min with this patient of which greater than 50% was spent in counseling and coordination of care with the patient.

## 2018-03-15 NOTE — OR Nursing (Signed)
Clamp time 14 minutes [id number].

## 2018-03-15 NOTE — Anesthesia Postprocedure Evaluation (Signed)
Anesthesia Post Note  Patient: Tanner Jackson  Procedure(s) Performed: ROBOTIC ASSITED PARTIAL NEPHRECTOMY (Left ) OPERATIVE ULTRASOUND (N/A )  Patient location during evaluation: PACU Anesthesia Type: General Level of consciousness: awake and alert and oriented Pain management: pain level controlled Vital Signs Assessment: post-procedure vital signs reviewed and stable Respiratory status: spontaneous breathing Cardiovascular status: blood pressure returned to baseline Anesthetic complications: no     Last Vitals:  Vitals:   03/15/18 1309 03/15/18 1329  BP: 136/85 (!) 140/94  Pulse: 80 81  Resp: 14 20  Temp: (!) 36.2 C (!) 36.4 C  SpO2: 95% 95%    Last Pain:  Vitals:   03/15/18 1329  TempSrc: Oral  PainSc:                  Khalila Buechner

## 2018-03-15 NOTE — Transfer of Care (Signed)
Immediate Anesthesia Transfer of Care Note  Patient: Tanner Jackson  Procedure(s) Performed: ROBOTIC ASSITED PARTIAL NEPHRECTOMY (Left ) OPERATIVE ULTRASOUND (N/A )  Patient Location: PACU  Anesthesia Type:General  Level of Consciousness: awake  Airway & Oxygen Therapy: Patient connected to face mask oxygen  Post-op Assessment: Post -op Vital signs reviewed and stable  Post vital signs: stable  Last Vitals:  Vitals Value Taken Time  BP 120/109 03/15/2018 12:31 PM  Temp    Pulse 86 03/15/2018 12:31 PM  Resp 18 03/15/2018 12:31 PM  SpO2 100 % 03/15/2018 12:31 PM    Last Pain:  Vitals:   03/15/18 0625  TempSrc: Temporal  PainSc: 0-No pain         Complications: No apparent anesthesia complications

## 2018-03-15 NOTE — Anesthesia Procedure Notes (Signed)
Arterial Line Insertion Start/End9/30/2019 8:25 AM, 03/15/2018 8:33 AM Performed by: Alvin Critchley, MD, Aline Brochure, CRNA, anesthesiologist  Patient location: OR. Preanesthetic checklist: patient identified, IV checked, site marked, risks and benefits discussed, surgical consent, monitors and equipment checked and pre-op evaluation Patient sedated Right, radial was placed Catheter size: 20 G Hand hygiene performed  Allen's test indicative of satisfactory collateral circulation Attempts: 1 Procedure performed without using ultrasound guided technique. Following insertion, dressing applied and Biopatch. Post procedure assessment: normal  Patient tolerated the procedure well with no immediate complications.

## 2018-03-16 LAB — BPAM RBC
BLOOD PRODUCT EXPIRATION DATE: 201910222359
BLOOD PRODUCT EXPIRATION DATE: 201910222359
Blood Product Expiration Date: 201910222359
Blood Product Expiration Date: 201910222359
ISSUE DATE / TIME: 201909301916
UNIT TYPE AND RH: 5100
UNIT TYPE AND RH: 5100
Unit Type and Rh: 5100
Unit Type and Rh: 5100

## 2018-03-16 LAB — CBC
HCT: 33.6 % — ABNORMAL LOW (ref 40.0–52.0)
Hemoglobin: 11.4 g/dL — ABNORMAL LOW (ref 13.0–18.0)
MCH: 30.4 pg (ref 26.0–34.0)
MCHC: 33.9 g/dL (ref 32.0–36.0)
MCV: 89.7 fL (ref 80.0–100.0)
PLATELETS: 214 10*3/uL (ref 150–440)
RBC: 3.74 MIL/uL — ABNORMAL LOW (ref 4.40–5.90)
RDW: 14.3 % (ref 11.5–14.5)
WBC: 12.5 10*3/uL — AB (ref 3.8–10.6)

## 2018-03-16 LAB — BASIC METABOLIC PANEL
ANION GAP: 8 (ref 5–15)
BUN: 15 mg/dL (ref 8–23)
CO2: 24 mmol/L (ref 22–32)
CREATININE: 1.29 mg/dL — AB (ref 0.61–1.24)
Calcium: 8.4 mg/dL — ABNORMAL LOW (ref 8.9–10.3)
Chloride: 106 mmol/L (ref 98–111)
GFR, EST NON AFRICAN AMERICAN: 56 mL/min — AB (ref >60)
GLUCOSE: 133 mg/dL — AB (ref 70–99)
Potassium: 4.3 mmol/L (ref 3.5–5.1)
Sodium: 138 mmol/L (ref 135–145)

## 2018-03-16 LAB — TYPE AND SCREEN
ABO/RH(D): O POS
ANTIBODY SCREEN: NEGATIVE
UNIT DIVISION: 0
UNIT DIVISION: 0
Unit division: 0
Unit division: 0

## 2018-03-16 LAB — PREPARE RBC (CROSSMATCH)

## 2018-03-16 MED ORDER — HEPARIN SODIUM (PORCINE) 5000 UNIT/ML IJ SOLN
5000.0000 [IU] | Freq: Three times a day (TID) | INTRAMUSCULAR | Status: DC
  Administered 2018-03-16 – 2018-03-17 (×3): 5000 [IU] via SUBCUTANEOUS
  Filled 2018-03-16 (×3): qty 1

## 2018-03-16 NOTE — Progress Notes (Signed)
1 Day Post-Op Subjective: The patient is doing well.  No nausea or vomiting. Pain is adequately controlled.  Objective: Vital signs in last 24 hours: Temp:  [96.7 F (35.9 C)-98.6 F (37 C)] 98.6 F (37 C) (10/01 0616) Pulse Rate:  [75-93] 75 (10/01 0616) Resp:  [11-20] 18 (10/01 0616) BP: (118-142)/(75-109) 118/75 (10/01 0616) SpO2:  [94 %-100 %] 97 % (10/01 0616) Arterial Line BP: (159-167)/(73-84) 167/73 (09/30 1309)  Intake/Output from previous day: 09/30 0701 - 10/01 0700 In: 4297.8 [I.V.:4197.8; IV Piggyback:100] Out: 3065 [Urine:2775; Drains:140; Blood:150] Intake/Output this shift: No intake/output data recorded.  Physical Exam:  General: Alert and oriented. CV: RRR Lungs: Clear bilaterally. GI: Soft, Nondistended. Incisions: Clean and dry. Urine: Clear, Foley in place Extremities: Nontender, no erythema, no edema.  Lab Results: Recent Labs    03/16/18 0310  HGB 11.4*  HCT 33.6*          Recent Labs    03/16/18 0310  CREATININE 1.29*           Results for orders placed or performed during the hospital encounter of 03/15/18 (from the past 24 hour(s))  CBC     Status: Abnormal   Collection Time: 03/16/18  3:10 AM  Result Value Ref Range   WBC 12.5 (H) 3.8 - 10.6 K/uL   RBC 3.74 (L) 4.40 - 5.90 MIL/uL   Hemoglobin 11.4 (L) 13.0 - 18.0 g/dL   HCT 33.6 (L) 40.0 - 52.0 %   MCV 89.7 80.0 - 100.0 fL   MCH 30.4 26.0 - 34.0 pg   MCHC 33.9 32.0 - 36.0 g/dL   RDW 14.3 11.5 - 14.5 %   Platelets 214 150 - 440 K/uL  Basic metabolic panel     Status: Abnormal   Collection Time: 03/16/18  3:10 AM  Result Value Ref Range   Sodium 138 135 - 145 mmol/L   Potassium 4.3 3.5 - 5.1 mmol/L   Chloride 106 98 - 111 mmol/L   CO2 24 22 - 32 mmol/L   Glucose, Bld 133 (H) 70 - 99 mg/dL   BUN 15 8 - 23 mg/dL   Creatinine, Ser 1.29 (H) 0.61 - 1.24 mg/dL   Calcium 8.4 (L) 8.9 - 10.3 mg/dL   GFR calc non Af Amer 56 (L) >60 mL/min   GFR calc Af Amer >60 >60 mL/min   Anion  gap 8 5 - 15    Assessment/Plan: POD# 1 s/p robotic partial nephrectomy.  1) Ambulate, Incentive spirometry 2) Advance diet as tolerated 3) Transition to oral pain medication 4) D/C Foley 5) Drain Cr  In AM   Hollice Espy, MD   LOS: 1 day   Hollice Espy 03/16/2018, 8:43 AM

## 2018-03-16 NOTE — Plan of Care (Signed)

## 2018-03-16 NOTE — Plan of Care (Signed)
  Problem: Education: Goal: Knowledge of General Education information will improve Description Including pain rating scale, medication(s)/side effects and non-pharmacologic comfort measures Outcome: Progressing   Problem: Health Behavior/Discharge Planning: Goal: Ability to manage health-related needs will improve Outcome: Progressing   Problem: Clinical Measurements: Goal: Ability to maintain clinical measurements within normal limits will improve Outcome: Progressing Goal: Will remain free from infection Outcome: Progressing Goal: Diagnostic test results will improve Outcome: Progressing Goal: Respiratory complications will improve Outcome: Progressing Goal: Cardiovascular complication will be avoided Outcome: Progressing   Problem: Activity: Goal: Risk for activity intolerance will decrease Outcome: Progressing   Problem: Nutrition: Goal: Adequate nutrition will be maintained Outcome: Progressing   Problem: Coping: Goal: Level of anxiety will decrease Outcome: Progressing   Problem: Elimination: Goal: Will not experience complications related to bowel motility Outcome: Progressing Goal: Will not experience complications related to urinary retention Outcome: Progressing   Problem: Pain Managment: Goal: General experience of comfort will improve Outcome: Progressing Pain managed with oral medications.    Problem: Safety: Goal: Ability to remain free from injury will improve Outcome: Progressing   Problem: Skin Integrity: Goal: Risk for impaired skin integrity will decrease Outcome: Progressing

## 2018-03-16 NOTE — Progress Notes (Signed)
Chaplain responded to an OR for an AD. Pt said he was not interest. He was preparing for lunch. Chaplain let him know we are available if needed.    03/16/18 1200  Clinical Encounter Type  Visited With Patient and family together  Visit Type Initial  Referral From Nurse  Spiritual Encounters  Spiritual Needs Brochure

## 2018-03-17 LAB — CBC
HCT: 36 % — ABNORMAL LOW (ref 40.0–52.0)
Hemoglobin: 12.6 g/dL — ABNORMAL LOW (ref 13.0–18.0)
MCH: 32 pg (ref 26.0–34.0)
MCHC: 35.1 g/dL (ref 32.0–36.0)
MCV: 91.4 fL (ref 80.0–100.0)
PLATELETS: 212 10*3/uL (ref 150–440)
RBC: 3.93 MIL/uL — AB (ref 4.40–5.90)
RDW: 14.4 % (ref 11.5–14.5)
WBC: 11.7 10*3/uL — AB (ref 3.8–10.6)

## 2018-03-17 LAB — CREATININE, FLUID (PLEURAL, PERITONEAL, JP DRAINAGE): Creat, Fluid: 1.5 mg/dL

## 2018-03-17 LAB — SURGICAL PATHOLOGY

## 2018-03-17 MED ORDER — DOCUSATE SODIUM 100 MG PO CAPS: 100.0000 mg | ORAL_CAPSULE | Freq: Two times a day (BID) | ORAL | 0 refills | Status: DC

## 2018-03-17 MED ORDER — OXYCODONE-ACETAMINOPHEN 5-325 MG PO TABS: 1.0000 | ORAL_TABLET | ORAL | 0 refills | Status: DC | PRN

## 2018-03-17 NOTE — Progress Notes (Signed)
Date of admission: 03/15/2018  Date of discharge: 03/17/2018  Admission diagnosis: Left renal mass  Discharge diagnosis: Left renal cell carcinoma  Secondary diagnoses:  Patient Active Problem List   Diagnosis Date Noted  . Left renal mass 03/15/2018  . Right ureteral stone 01/05/2018  . Hypomagnesemia 01/05/2018  . History of PTCA 01/05/2018  . History of echocardiogram 01/05/2018  . Hemorrhoids 01/05/2018  . GERD (gastroesophageal reflux disease) 01/05/2018  . Coronary artery disease 01/05/2018  . Anemia 01/05/2018  . Abnormal nuclear stress test 01/05/2018  . HTN, goal below 130/80 08/11/2017  . Impaired fasting glucose 10/02/2015  . Status post percutaneous transluminal coronary angioplasty 09/29/2012  . Nonspecific abnormal cardiovascular system function study 09/29/2012  . Hyperlipidemia 09/29/2012  . History of other specified conditions presenting hazards to health 09/29/2012    History and Physical: For full details, please see admission history and physical. Briefly, Tanner Jackson is a 67 y.o. year old patient with 3 cm left renal mass admitted following uncomplicated left robotic partial nephrectomy.   Hospital Course: Patient tolerated the procedure well.  He was then transferred to the floor after an uneventful PACU stay.  His hospital course was uncomplicated.  On POD#2 he had met discharge criteria: was eating a regular diet, was up and ambulating independently,  pain was well controlled, was voiding without a catheter, and was ready to for discharge.  JP drain was removed just prior to discharge with creatinine consistent with serum.  Physical Exam  Constitutional: He is oriented to person, place, and time. He appears well-developed and well-nourished.  HENT:  Head: Normocephalic and atraumatic.  Neck: Normal range of motion.  Cardiovascular: Normal rate.  Pulmonary/Chest: Effort normal.  Abdominal: Soft. He exhibits no distension.  Appropriately tender.   Incisions clean dry and intact.  Nondistended.  JP with serosanguineous fluid.  Genitourinary: Penis normal.  Neurological: He is alert and oriented to person, place, and time.  Skin: Skin is warm and dry.  Psychiatric: He has a normal mood and affect.  Vitals reviewed.   Laboratory values:  Recent Labs    03/16/18 0310 03/17/18 0838  WBC 12.5* 11.7*  HGB 11.4* 12.6*  HCT 33.6* 36.0*   Recent Labs    03/16/18 0310  NA 138  K 4.3  CL 106  CO2 24  GLUCOSE 133*  BUN 15  CREATININE 1.29*  CALCIUM 8.4*   No results for input(s): LABPT, INR in the last 72 hours. No results for input(s): LABURIN in the last 72 hours. Results for orders placed or performed during the hospital encounter of 03/08/18  Urine culture     Status: None   Collection Time: 03/08/18  9:34 AM  Result Value Ref Range Status   Specimen Description   Final    URINE, CLEAN CATCH Performed at [facility], [address], [place], [place] [address]    Special Requests   Final    NONE Performed at [facility], [address], [place], [place] [address]    Culture   Final    NO GROWTH Performed at [facility], [place] [address] [place], [place] [address]    Report Status 03/09/2018 FINAL  Final    Disposition: Home  Discharge instruction: See discharge instruction sheet  Please hold your aspirin for 1 week postop  Discharge medications:  Allergies as of 03/17/2018   No Known Allergies     Medication List    TAKE these medications   aspirin  EC 81 MG tablet Take 81 mg by mouth daily.   atorvastatin 10 MG tablet Commonly known as:  LIPITOR Take 10 mg by mouth every evening.   CLEAR EYES REDNESS RELIEF 0.012-0.2 % Soln Generic drug:  naphazoline-glycerin Place 1 drop into both eyes 4 (four) times daily as needed for eye irritation.   docusate sodium 100 MG capsule Commonly known as:  COLACE Take 1 capsule (100 mg total) by mouth 2 (two) times daily.    metoprolol succinate 25 MG 24 hr tablet Commonly known as:  TOPROL-XL Take 25 mg by mouth every evening.   multivitamin with minerals Tabs tablet Take 1 tablet by mouth daily.   naproxen sodium 220 MG tablet Commonly known as:  ALEVE Take 220 mg by mouth daily as needed.   oxyCODONE-acetaminophen 5-325 MG tablet Commonly known as:  PERCOCET/ROXICET Take 1-2 tablets by mouth every 4 (four) hours as needed for moderate pain or severe pain.       Followup:  Follow-up Information    Hollice Espy, MD In 4 weeks.   Specialty:  Urology Contact information: [place] [id number] [phone]

## 2018-03-17 NOTE — Discharge Instructions (Signed)
·   Activity:  You are encouraged to ambulate frequently (about every hour during waking hours) to help prevent blood clots from forming in your legs or lungs.  However, you should not engage in any heavy lifting (> 5-10 lbs), strenuous activity, or straining. ° °· Diet: You should advance your diet as instructed by your physician.  It will be normal to have some bloating, nausea, and abdominal discomfort intermittently. ° °· Prescriptions:  You will be provided a prescription for pain medication to take as needed.  If your pain is not severe enough to require the prescription pain medication, you may take extra strength Tylenol instead which will have less side effects.  You should also take a prescribed stool softener to avoid straining with bowel movements as the prescription pain medication may constipate you. ° °· Incisions: You may remove your dressing bandages 48 hours after surgery if not removed in the hospital.  You will either have some small staples or special tissue glue at each of the incision sites. Once the bandages are removed (if present), the incisions may stay open to air.  You may start showering (but not soaking or bathing in water) the 2nd day after surgery and the incisions simply need to be patted dry after the shower.  No additional care is needed. ° °What to call us about: You should call the office if you develop fever > 101 or develop persistent vomiting, redness or draining around your incision, or any other concerning symptoms.   ° °[facility] °[address], [address] °[place], [place] [address] °[phone] ° ° °

## 2018-03-18 NOTE — Discharge Summary (Signed)
Date of admission: 03/15/2018  Date of discharge: 03/17/2018  Admission diagnosis: Left renal mass  Discharge diagnosis: Left renal cell carcinoma  Secondary diagnoses:      Patient Active Problem List   Diagnosis Date Noted  . Left renal mass 03/15/2018  . Right ureteral stone 01/05/2018  . Hypomagnesemia 01/05/2018  . History of PTCA 01/05/2018  . History of echocardiogram 01/05/2018  . Hemorrhoids 01/05/2018  . GERD (gastroesophageal reflux disease) 01/05/2018  . Coronary artery disease 01/05/2018  . Anemia 01/05/2018  . Abnormal nuclear stress test 01/05/2018  . HTN, goal below 130/80 08/11/2017  . Impaired fasting glucose 10/02/2015  . Status post percutaneous transluminal coronary angioplasty 09/29/2012  . Nonspecific abnormal cardiovascular system function study 09/29/2012  . Hyperlipidemia 09/29/2012  . History of other specified conditions presenting hazards to health 09/29/2012    History and Physical: For full details, please see admission history and physical. Briefly, Tanner Jackson is a 67 y.o. year old patient with 3 cm left renal mass admitted following uncomplicated left robotic partial nephrectomy.   Hospital Course: Patient tolerated the procedure well.  He was then transferred to the floor after an uneventful PACU stay.  His hospital course was uncomplicated.  On POD#2 he had met discharge criteria: was eating a regular diet, was up and ambulating independently,  pain was well controlled, was voiding without a catheter, and was ready to for discharge.  JP drain was removed just prior to discharge with creatinine consistent with serum.  Physical Exam  Constitutional: He is oriented to person, place, and time. He appears well-developed and well-nourished.  HENT:  Head: Normocephalic and atraumatic.  Neck: Normal range of motion.  Cardiovascular: Normal rate.  Pulmonary/Chest: Effort normal.  Abdominal: Soft. He exhibits no distension.   Appropriately tender.  Incisions clean dry and intact.  Nondistended.  JP with serosanguineous fluid.  Genitourinary: Penis normal.  Neurological: He is alert and oriented to person, place, and time.  Skin: Skin is warm and dry.  Psychiatric: He has a normal mood and affect.  Vitals reviewed.   Laboratory values:  RecentLabs(last2labs)  Recent Labs    03/16/18 0310 03/17/18 0838  WBC 12.5* 11.7*  HGB 11.4* 12.6*  HCT 33.6* 36.0*     RecentLabs(last2labs)  Recent Labs    03/16/18 0310  NA 138  K 4.3  CL 106  CO2 24  GLUCOSE 133*  BUN 15  CREATININE 1.29*  CALCIUM 8.4*     RecentLabs(last2labs)  No results for input(s): LABPT, INR in the last 72 hours.   RecentLabs(last2labs)  No results for input(s): LABURIN in the last 72 hours.          Results for orders placed or performed during the hospital encounter of 03/08/18  Urine culture     Status: None   Collection Time: 03/08/18  9:34 AM  Result Value Ref Range Status   Specimen Description   Final    URINE, CLEAN CATCH Performed at [facility], [address] [place], [place] [address]    Special Requests   Final    NONE Performed at [facility], [address], [place], [place] [address]    Culture   Final    NO GROWTH Performed at [facility], [place] [address] [place], [place] [address]    Report Status 03/09/2018 FINAL  Final    Disposition: Home  Discharge instruction: See discharge instruction sheet  Please hold your aspirin for 1 week postop  Discharge medications:  Allergies as of 03/17/2018   No Known Allergies        Medication List    TAKE these medications   aspirin EC 81 MG tablet Take 81 mg by mouth daily.   atorvastatin 10 MG tablet Commonly known as:  LIPITOR Take 10 mg by mouth every evening.   CLEAR EYES REDNESS RELIEF 0.012-0.2 % Soln Generic drug:  naphazoline-glycerin Place 1 drop  into both eyes 4 (four) times daily as needed for eye irritation.   docusate sodium 100 MG capsule Commonly known as:  COLACE Take 1 capsule (100 mg total) by mouth 2 (two) times daily.   metoprolol succinate 25 MG 24 hr tablet Commonly known as:  TOPROL-XL Take 25 mg by mouth every evening.   multivitamin with minerals Tabs tablet Take 1 tablet by mouth daily.   naproxen sodium 220 MG tablet Commonly known as:  ALEVE Take 220 mg by mouth daily as needed.   oxyCODONE-acetaminophen 5-325 MG tablet Commonly known as:  PERCOCET/ROXICET Take 1-2 tablets by mouth every 4 (four) hours as needed for moderate pain or severe pain.       Followup:     Follow-up Information    Hollice Espy, MD In 4 weeks.   Specialty:  Urology Contact information: [place] [address] [phone]

## 2018-03-23 ENCOUNTER — Telehealth: Payer: Self-pay

## 2018-03-23 NOTE — Telephone Encounter (Signed)
Flagged on EMMI report for having unfilled prescriptions.  Called and spoke with patient.  He mentioned he was able to fill his medications.  No questions or concerns currently.  I thanked him for his time.

## 2018-04-14 ENCOUNTER — Ambulatory Visit (INDEPENDENT_AMBULATORY_CARE_PROVIDER_SITE_OTHER): Payer: Medicare Other | Admitting: Urology

## 2018-04-14 ENCOUNTER — Encounter: Payer: Self-pay | Admitting: Urology

## 2018-04-14 VITALS — BP 142/88 | HR 73 | Ht 66.0 in | Wt 198.0 lb

## 2018-04-14 DIAGNOSIS — N2889 Other specified disorders of kidney and ureter: Secondary | ICD-10-CM

## 2018-04-14 DIAGNOSIS — C642 Malignant neoplasm of left kidney, except renal pelvis: Secondary | ICD-10-CM

## 2018-04-14 NOTE — Progress Notes (Signed)
04/14/2018 11:33 AM   Albertha Ghee 1951/03/22 [id number]  Referring provider: Wilhelmina Mcardle, MD [address] [place], [place] [address]  Chief Complaint  Patient presents with  . Post-op Follow-up    4wk    HPI: 67 year old male who returns today for follow-up following uncomplicated robotic left partial nephrectomy.  He was taken to the operating room on 03/15/2018 for excision of a 3 cm renal mass.  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  Margins were negative.  He also had an incidental 1.4 cm indeterminate lesion on the mid posterior left kidney.  This was -not treated at the time of partial nephrectomy.  Postoperatively, he is done well.  He said no significant issues.  His wounds are healing well.  He moved to grass last week and is anxious to return to full activity.                                            PMH: Past Medical History:  Diagnosis Date  . Abnormal nuclear stress test 01/05/2018   6/13:DRH:distal lad ischemia, ef normal  . Anemia 01/05/2018  . Coronary artery disease   . GERD (gastroesophageal reflux disease) 01/05/2018  . Hemorrhoids 01/05/2018  . History of PTCA 01/05/2018   6/13:DES to LAD.3.5 x 58mm Expedition. RCA dominant mild to moderate. Circ LM ok.  . Hyperlipidemia 09/29/2012  . Hypomagnesemia 01/05/2018  . Right ureteral stone 01/05/2018  . Status post percutaneous transluminal coronary angioplasty 09/29/2012   6/13:DES to LAD.3.5 x 67mm Expedition. RCA dominant mild to moderate. Circ LM ok.    Surgical History: Past Surgical History:  Procedure Laterality Date  . CORONARY ANGIOPLASTY WITH STENT PLACEMENT    . OPERATIVE ULTRASOUND N/A 03/15/2018   Procedure: OPERATIVE ULTRASOUND;  Surgeon: Hollice Espy, MD;  Location: [facility] ORS;  Service: Urology;  Laterality: N/A;  . ROBOTIC ASSITED PARTIAL NEPHRECTOMY Left 03/15/2018   Procedure: ROBOTIC ASSITED PARTIAL NEPHRECTOMY;  Surgeon: Hollice Espy, MD;  Location: [facility] ORS;  Service: Urology;  Laterality: Left;    Home Medications:  Allergies as of 04/14/2018   No Known Allergies     Medication List        Accurate as of 04/14/18 11:33 AM. Always use your most recent med list.          aspirin EC 81 MG tablet Take 81 mg by mouth daily.   atorvastatin 10 MG tablet Commonly known as:  LIPITOR Take 10 mg by mouth every evening.   CLEAR EYES REDNESS RELIEF 0.012-0.2 % Soln Generic drug:  naphazoline-glycerin Place 1 drop into both eyes 4 (four) times daily as needed for eye irritation.   metoprolol succinate 25 MG 24 hr tablet Commonly known as:  TOPROL-XL Take 25 mg by mouth every evening.   multivitamin with minerals Tabs tablet Take 1 tablet by mouth daily.   naproxen sodium 220 MG tablet Commonly known as:  ALEVE Take 220 mg by mouth daily as needed.       Allergies: No Known Allergies  Family History: Family History  Problem Relation Age of Onset  . Kidney failure Mother     Social History:  reports that he has never smoked. He has never used smokeless tobacco. He reports that he drinks alcohol. He reports that he does not use drugs.  ROS: UROLOGY Frequent  Urination?: No Hard to postpone urination?: No Burning/pain with urination?: No Get up at night to urinate?: No Leakage of urine?: No Urine stream starts and stops?: No Trouble starting stream?: No Do you have to strain to urinate?: No Blood in urine?: No Urinary tract infection?: No Sexually transmitted disease?: No Injury to kidneys or bladder?: No Painful intercourse?: No Weak stream?: No Erection problems?: No Penile pain?: No  Gastrointestinal Nausea?: No Vomiting?: No Indigestion/heartburn?: No Diarrhea?: No Constipation?: No  Constitutional Fever: No Night sweats?: No Weight loss?: No Fatigue?: No  Skin Skin rash/lesions?: No Itching?: No  Eyes Blurred vision?: No Double vision?:  No  Ears/Nose/Throat Sore throat?: No Sinus problems?: No  Hematologic/Lymphatic Swollen glands?: No Easy bruising?: No  Cardiovascular Leg swelling?: No Chest pain?: No  Respiratory Cough?: No Shortness of breath?: No  Endocrine Excessive thirst?: No  Musculoskeletal Back pain?: No Joint pain?: No  Neurological Headaches?: No Dizziness?: No  Psychologic Depression?: No Anxiety?: No  Physical Exam: BP (!) 142/88   Pulse 73   Ht 5\' 6"  (1.676 m)   Wt 198 lb (89.8 kg)   BMI 31.96 kg/m   Constitutional:  Alert and oriented, No acute distress.  Accompanied by wife today. HEENT: [place] AT, moist mucus membranes.  Trachea midline, no masses. Cardiovascular: No clubbing, cyanosis, or edema. Respiratory: Normal respiratory effort, no increased work of breathing. GI: Abdomen is soft, nontender, nondistended, incisions well-healed Skin: No rashes, bruises or suspicious lesions. Neurologic: Grossly intact, no focal deficits, moving all 4 extremities. Psychiatric: Normal mood and affect.  Laboratory Data: Lab Results  Component Value Date   WBC 11.7 (H) 03/17/2018   HGB 12.6 (L) 03/17/2018   HCT 36.0 (L) 03/17/2018   MCV 91.4 03/17/2018   PLT 212 03/17/2018    Lab Results  Component Value Date   CREATININE 1.29 (H) 03/16/2018    Assessment & Plan:    1. Carcinoma, renal cell, left ([place]) Status post uncomplicated left partial nephrectomy Surgical pathology reviewed today Per NCCN guidelines, recommend annual CT scan with chest x-ray x3 years CBC/BMP postop performed today Cleared to return to regular activity  2. Left renal mass Indeterminate 1.4 cm left posterior renal lesion, may represent hemorrhagic cyst versus malignancy We will continue to follow with annual scans Favorable location for percutaneous intervention if the lesion grows - Basic metabolic panel - CBC with Differential/Platelet   Return in about 1 year (around 04/15/2019) for CT abd +  CXR.  Hollice Espy, MD  [facility] [address] [place], [place] [address] [phone]

## 2018-04-15 LAB — CBC WITH DIFFERENTIAL/PLATELET
BASOS ABS: 0 10*3/uL (ref 0.0–0.2)
BASOS: 1 %
EOS (ABSOLUTE): 0.2 10*3/uL (ref 0.0–0.4)
Eos: 4 %
Hematocrit: 36 % — ABNORMAL LOW (ref 37.5–51.0)
Hemoglobin: 12.1 g/dL — ABNORMAL LOW (ref 13.0–17.7)
Immature Grans (Abs): 0 10*3/uL (ref 0.0–0.1)
Immature Granulocytes: 0 %
LYMPHS ABS: 1.8 10*3/uL (ref 0.7–3.1)
Lymphs: 31 %
MCH: 29.5 pg (ref 26.6–33.0)
MCHC: 33.6 g/dL (ref 31.5–35.7)
MCV: 88 fL (ref 79–97)
MONOS ABS: 0.6 10*3/uL (ref 0.1–0.9)
Monocytes: 10 %
NEUTROS ABS: 3.2 10*3/uL (ref 1.4–7.0)
Neutrophils: 54 %
PLATELETS: 223 10*3/uL (ref 150–450)
RBC: 4.1 x10E6/uL — ABNORMAL LOW (ref 4.14–5.80)
RDW: 12.4 % (ref 12.3–15.4)
WBC: 5.9 10*3/uL (ref 3.4–10.8)

## 2018-04-15 LAB — BASIC METABOLIC PANEL
BUN/Creatinine Ratio: 10 (ref 10–24)
BUN: 11 mg/dL (ref 8–27)
CHLORIDE: 103 mmol/L (ref 96–106)
CO2: 23 mmol/L (ref 20–29)
CREATININE: 1.12 mg/dL (ref 0.76–1.27)
Calcium: 9.5 mg/dL (ref 8.6–10.2)
GFR calc Af Amer: 79 mL/min/{1.73_m2} (ref >59)
GFR calc non Af Amer: 68 mL/min/{1.73_m2} (ref >59)
GLUCOSE: 132 mg/dL — AB (ref 65–99)
POTASSIUM: 4.2 mmol/L (ref 3.5–5.2)
SODIUM: 140 mmol/L (ref 134–144)

## 2019-03-29 ENCOUNTER — Other Ambulatory Visit: Payer: Self-pay

## 2019-03-29 ENCOUNTER — Ambulatory Visit
Admission: RE | Admit: 2019-03-29 | Discharge: 2019-03-29 | Disposition: A | Discharge status: Home / Self Care | Payer: Medicare Other | Source: Ambulatory Visit | Attending: Urology | Admitting: Urology

## 2019-03-29 DIAGNOSIS — N2889 Other specified disorders of kidney and ureter: Secondary | ICD-10-CM

## 2019-03-29 HISTORY — DX: Malignant (primary) neoplasm, unspecified: C80.1

## 2019-03-29 HISTORY — DX: Essential (primary) hypertension: I10

## 2019-03-29 LAB — POCT I-STAT CREATININE: Creatinine, Ser: 1.1 mg/dL (ref 0.61–1.24)

## 2019-03-29 MED ORDER — IOHEXOL 300 MG/ML  SOLN
100.0000 mL | Freq: Once | INTRAMUSCULAR | Status: AC | PRN
  Administered 2019-03-29: 100 mL via INTRAVENOUS

## 2019-04-14 ENCOUNTER — Telehealth: Payer: Self-pay | Admitting: *Deleted

## 2019-04-14 NOTE — Telephone Encounter (Signed)
Patient had CT done 03/29/19 did not have CXR done-will get done before appointment next week. Verbalized understanding.

## 2019-04-19 ENCOUNTER — Ambulatory Visit
Admission: RE | Admit: 2019-04-19 | Discharge: 2019-04-19 | Disposition: A | Discharge status: Home / Self Care | Payer: Medicare Other | Source: Ambulatory Visit | Attending: Urology | Admitting: Urology

## 2019-04-19 ENCOUNTER — Ambulatory Visit: Payer: Medicare Other | Admitting: Urology

## 2019-04-19 ENCOUNTER — Other Ambulatory Visit: Payer: Self-pay

## 2019-04-19 ENCOUNTER — Encounter: Payer: Self-pay | Admitting: Urology

## 2019-04-19 VITALS — BP 142/76 | HR 82 | Ht 66.0 in | Wt 210.0 lb

## 2019-04-19 DIAGNOSIS — C642 Malignant neoplasm of left kidney, except renal pelvis: Secondary | ICD-10-CM

## 2019-04-19 DIAGNOSIS — N2889 Other specified disorders of kidney and ureter: Secondary | ICD-10-CM

## 2019-04-19 DIAGNOSIS — N201 Calculus of ureter: Secondary | ICD-10-CM

## 2019-04-19 NOTE — Progress Notes (Signed)
04/19/2019 7:11 PM   Albertha Ghee 1951-01-05 [id number]  Referring provider: Wilhelmina Mcardle, MD [address] [place],  [place] [address]  Chief Complaint  Patient presents with  . Follow-up    HPI: 68 year old male with a personal history of renal cell carcinoma who returns today for routine surveillance.  He was taken to the operating room on 03/15/2018 for excision of a 3 cm renal mass.  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  Margins were negative.  He also had an incidental 1.4 cm indeterminate lesion on the mid posterior left kidney.  This was not treated at the time of partial nephrectomy.  He returns today with follow-up CT abdomen pelvis with and without contrast performed on 03/29/2019.  This shows no evidence of local recurrence.  There is equivocal enhancement of the 1.5 x 1.0 cm left mid posterior renal mass.  He has nonobstructing right-sided.  Chest x-ray is negative.  Overall, he is doing very well this year.  No new medical issues.  No flank pain or gross hematuria.  No weight loss.   PMH: Past Medical History:  Diagnosis Date  . Abnormal nuclear stress test 01/05/2018   6/13:DRH:distal lad ischemia, ef normal  . Anemia 01/05/2018  . Cancer ([place])   . Coronary artery disease   . GERD (gastroesophageal reflux disease) 01/05/2018  . Hemorrhoids 01/05/2018  . History of PTCA 01/05/2018   6/13:DES to LAD.3.5 x 34mm Expedition. RCA dominant mild to moderate. Circ LM ok.  . Hyperlipidemia 09/29/2012  . Hypertension   . Hypomagnesemia 01/05/2018  . Right ureteral stone 01/05/2018  . Status post percutaneous transluminal coronary angioplasty 09/29/2012   6/13:DES to LAD.3.5 x 59mm Expedition. RCA dominant mild to moderate. Circ LM ok.    Surgical History: Past Surgical History:  Procedure Laterality Date  . CORONARY ANGIOPLASTY WITH STENT PLACEMENT    . OPERATIVE ULTRASOUND N/A 03/15/2018   Procedure: OPERATIVE  ULTRASOUND;  Surgeon: Hollice Espy, MD;  Location: [facility] ORS;  Service: Urology;  Laterality: N/A;  . ROBOTIC ASSITED PARTIAL NEPHRECTOMY Left 03/15/2018   Procedure: ROBOTIC ASSITED PARTIAL NEPHRECTOMY;  Surgeon: Hollice Espy, MD;  Location: [facility] ORS;  Service: Urology;  Laterality: Left;    Home Medications:  Allergies as of 04/19/2019   No Known Allergies     Medication List       Accurate as of April 19, 2019  7:11 PM. If you have any questions, ask your nurse or doctor.        aspirin EC 81 MG tablet Take 81 mg by mouth daily.   atorvastatin 10 MG tablet Commonly known as: LIPITOR Take 10 mg by mouth every evening.   Clear Eyes Redness Relief 0.012-0.2 % Soln Generic drug: naphazoline-glycerin Place 1 drop into both eyes 4 (four) times daily as needed for eye irritation.   metoprolol succinate 25 MG 24 hr tablet Commonly known as: TOPROL-XL Take 25 mg by mouth every evening.   multivitamin with minerals Tabs tablet Take 1 tablet by mouth daily.   naproxen sodium 220 MG tablet Commonly known as: ALEVE Take 220 mg by mouth daily as needed.       Allergies: No Known Allergies  Family History: Family History  Problem Relation Age of Onset  . Kidney failure Mother     Social History:  reports that he has never smoked. He has never used smokeless tobacco. He reports current alcohol use. He reports that he does  not use drugs.  ROS: UROLOGY Frequent Urination?: No Hard to postpone urination?: No Burning/pain with urination?: No Get up at night to urinate?: No Leakage of urine?: No Urine stream starts and stops?: No Trouble starting stream?: No Do you have to strain to urinate?: No Blood in urine?: No Urinary tract infection?: No Sexually transmitted disease?: No Injury to kidneys or bladder?: No Painful intercourse?: No Weak stream?: No Erection problems?: No Penile pain?: No  Gastrointestinal Nausea?: No Vomiting?: No  Indigestion/heartburn?: No Diarrhea?: No Constipation?: No  Constitutional Fever: No Night sweats?: No Weight loss?: No Fatigue?: No  Skin Skin rash/lesions?: No Itching?: No  Eyes Blurred vision?: No Double vision?: No  Ears/Nose/Throat Sore throat?: No Sinus problems?: No  Hematologic/Lymphatic Swollen glands?: No Easy bruising?: No  Cardiovascular Leg swelling?: No Chest pain?: No  Respiratory Cough?: No Shortness of breath?: No  Endocrine Excessive thirst?: No  Musculoskeletal Back pain?: No Joint pain?: No  Neurological Headaches?: No Dizziness?: No  Psychologic Depression?: No Anxiety?: No  Physical Exam: BP (!) 142/76   Pulse 82   Ht 5\' 6"  (1.676 m)   Wt 210 lb (95.3 kg)   BMI 33.89 kg/m   Constitutional:  Alert and oriented, No acute distress. HEENT: [place] AT, moist mucus membranes.  Trachea midline, no masses. Cardiovascular: No clubbing, cyanosis, or edema. Respiratory: Normal respiratory effort, no increased work of breathing. GI: Abdomen is soft, nontender, nondistended, no abdominal masses GU: No CVA tenderness Lymph: No cervical or inguinal lymphadenopathy. Skin: No rashes, bruises or suspicious lesions. Neurologic: Grossly intact, no focal deficits, moving all 4 extremities. Psychiatric: Normal mood and affect.  Laboratory Data: Lab Results  Component Value Date   WBC 5.9 04/14/2018   HGB 12.1 (L) 04/14/2018   HCT 36.0 (L) 04/14/2018   MCV 88 04/14/2018   PLT 223 04/14/2018    Lab Results  Component Value Date   CREATININE 1.10 03/29/2019   Pertinent Imaging: CLINICAL DATA:  Restaging of renal cell carcinoma. Left partial nephrectomy in 2019.  EXAM: CT ABDOMEN WITHOUT AND WITH CONTRAST  TECHNIQUE: Multidetector CT imaging of the abdomen was performed following the standard protocol before and following the bolus administration of intravenous contrast.  CONTRAST:  144mL OMNIPAQUE IOHEXOL 300 MG/ML  SOLN   COMPARISON:  12/29/2017 and MRI from 01/28/2018  FINDINGS: Lower chest: Unremarkable  Hepatobiliary: Unremarkable  Pancreas: Unremarkable  Spleen: Unremarkable  Adrenals/Urinary Tract: Adrenal glands normal. Partial left nephrectomy laterally. No findings of local recurrence. A 1.5 by 1.0 cm left mid kidney medially on image 79/15 is again observed, measuring about 31 Hounsfield units precontrast and about 49 Hounsfield units on arterial phase images. This appearance is suspicious for low-grade enhancement and this may represent a second renal cell carcinoma.  4 mm right kidney upper pole nonobstructive renal calculus and 5 mm right mid kidney nonobstructive renal calculus.  Stomach/Bowel: Unremarkable  Vascular/Lymphatic: Aortoiliac atherosclerotic vascular disease. No pathologic adenopathy. No tumor thrombus in the left renal vein.  Other: No supplemental non-categorized findings.  Musculoskeletal: Lower lumbar degenerative disc disease.  IMPRESSION: 1. No recurrence of the partial left nephrectomy site. 2. A 1.5 by 1.0 cm left mid kidney hypodense lesion is again observed, demonstrating low-grade enhancement of about 18 Hounsfield units between precontrast and arterial phase images. This is suspicious for true contrast and this may represent a small second renal cell carcinoma. 3. Nonobstructive right nephrolithiasis. 4.  Aortic Atherosclerosis (ICD10-I70.0). 5. Lower lumbar degenerative disc disease.   Electronically Signed  By: Van Clines M.D.   On: 03/29/2019 13:04  CT scan was personally reviewed today.  Agree with radiologic interpretation.  In comparison to MRI in noncontrast CT scan from 2019, there is perhaps slight interval growth although minimal, 1 to 2 mm at max.  Agree with radiologic interpretation otherwise.  Chest x-ray was also reviewed which is negative.  Assessment & Plan:    1. Right ureteral stone Asymptomatic, will  continue to follow serial imaging  2. Renal mass, left We discussed the findings of indeterminate, possibly enhancing left renal mass.  Interval imaging today over 1 year shows minimal growth, definitively less than 3 mm annually.  This may be in fact last.  Enhancement is also equivocal.  We discussed the differential diagnosis which includes possible renal cell carcinoma.  We discussed management options for this including biopsy, biopsy with cryoablation, or continued observation in detail.  Given the very low growth rate, he is most interested in observation.  As such, we will plan to repeat imaging in a year.  If there is been any significant growth in the interim, he may consider cryotherapy at this point.  He understood the importance of timely and thorough follow-up. - CT Abd Wo & W Cm; Future - Chest 1 View; Future  3. Carcinoma, renal cell, left ([place]) Status post partial nephrectomy  Evidence of local recurrence at resection bed, chest x-ray also negative.  Currently NED.  Plan for continued annual surveillance x3 years.   Return in about 1 year (around 04/18/2020) for CT scan, CXR.  Hollice Espy, MD  [facility] [address] [place], [place] [address] [phone]  I spent 25 min with this patient of which greater than 50% was spent in counseling and coordination of care with the patient.

## 2019-08-07 ENCOUNTER — Ambulatory Visit: Payer: Medicare PPO | Attending: Internal Medicine

## 2019-08-07 DIAGNOSIS — Z23 Encounter for immunization: Secondary | ICD-10-CM | POA: Insufficient documentation

## 2019-08-07 NOTE — Progress Notes (Signed)
   Covid-19 Vaccination Clinic  Name:  Tanner Jackson    MRN: [id number] DOB: 23-May-1951  08/07/2019  [name] was observed post Covid-19 immunization for 15 minutes without incidence. He was provided with Vaccine Information Sheet and instruction to access the V-Safe system.   [name] was instructed to call 911 with any severe reactions post vaccine: Marland Kitchen Difficulty breathing  . Swelling of your face and throat  . A fast heartbeat  . A bad rash all over your body  . Dizziness and weakness    Immunizations Administered    Name Date Dose VIS Date Route   Pfizer COVID-19 Vaccine 08/07/2019 10:00 AM 0.3 mL 05/27/2019 Intramuscular   Manufacturer: [place]   Lot: [id number]   [place]: [id number]

## 2019-08-30 ENCOUNTER — Ambulatory Visit: Payer: Medicare PPO | Attending: Internal Medicine

## 2019-08-30 DIAGNOSIS — Z23 Encounter for immunization: Secondary | ICD-10-CM

## 2019-08-30 NOTE — Progress Notes (Signed)
   Covid-19 Vaccination Clinic  Name:  Tanner Jackson    MRN: [id number] DOB: 1951-01-02  08/30/2019  Tanner Jackson was observed post Covid-19 immunization for 15 minutes without incident. He was provided with Vaccine Information Sheet and instruction to access the V-Safe system.   Tanner Jackson was instructed to call 911 with any severe reactions post vaccine: Marland Kitchen Difficulty breathing  . Swelling of face and throat  . A fast heartbeat  . A bad rash all over body  . Dizziness and weakness   Immunizations Administered    Name Date Dose VIS Date Route   Pfizer COVID-19 Vaccine 08/30/2019  8:56 AM 0.3 mL 05/27/2019 Intramuscular   Manufacturer: [place]   Lot: [id number]   [place]: [id number]

## 2020-04-06 ENCOUNTER — Other Ambulatory Visit: Payer: Self-pay

## 2020-04-06 ENCOUNTER — Ambulatory Visit
Admission: RE | Admit: 2020-04-06 | Discharge: 2020-04-06 | Disposition: A | Discharge status: Home / Self Care | Payer: Medicare PPO | Source: Ambulatory Visit | Attending: Urology | Admitting: Urology

## 2020-04-06 DIAGNOSIS — N2889 Other specified disorders of kidney and ureter: Secondary | ICD-10-CM | POA: Diagnosis not present

## 2020-04-06 LAB — POCT I-STAT CREATININE: Creatinine, Ser: 1.5 mg/dL — ABNORMAL HIGH (ref 0.61–1.24)

## 2020-04-06 MED ORDER — IOHEXOL 350 MG/ML SOLN
100.0000 mL | Freq: Once | INTRAVENOUS | Status: AC | PRN
  Administered 2020-04-06: 100 mL via INTRAVENOUS

## 2020-04-06 MED ORDER — IOHEXOL 300 MG/ML  SOLN
100.0000 mL | Freq: Once | INTRAMUSCULAR | Status: AC | PRN
  Administered 2020-04-06: 100 mL via INTRAVENOUS

## 2020-04-09 ENCOUNTER — Telehealth: Payer: Self-pay | Admitting: Urology

## 2020-04-09 NOTE — Telephone Encounter (Signed)
Patient called the office today to let us know that he passed his kidney stone on Saturday, 10/23.  He was able to collect the stone and will bring it with him to his follow up appointment on 11/9.

## 2020-04-10 ENCOUNTER — Telehealth: Payer: Self-pay

## 2020-04-10 NOTE — Telephone Encounter (Signed)
sw patient. He passed stone Saturday night and will bring stone to office visit as scheduled.

## 2020-04-10 NOTE — Telephone Encounter (Signed)
-----   Message from Hollice Espy, MD sent at 04/09/2020  1:25 PM EDT ----- Looks like this patient might be passing a stone on his right side based on CT scan.  Please let him know, push fluids, and review warning symptoms or indication for more urgent/emergent evaluation.  We will see him in clinic as scheduled.  We may get a KUB x-ray on this day to see the size and location of the stone.  Hollice Espy, MD

## 2020-04-24 ENCOUNTER — Ambulatory Visit: Payer: Medicare PPO | Admitting: Urology

## 2020-04-24 ENCOUNTER — Ambulatory Visit
Admission: RE | Admit: 2020-04-24 | Discharge: 2020-04-24 | Disposition: A | Discharge status: Home / Self Care | Payer: Medicare PPO | Source: Ambulatory Visit | Attending: Urology | Admitting: Urology

## 2020-04-24 ENCOUNTER — Other Ambulatory Visit: Payer: Self-pay

## 2020-04-24 VITALS — BP 153/92 | HR 71 | Ht 66.0 in | Wt 200.0 lb

## 2020-04-24 DIAGNOSIS — C642 Malignant neoplasm of left kidney, except renal pelvis: Secondary | ICD-10-CM | POA: Diagnosis not present

## 2020-04-24 DIAGNOSIS — N201 Calculus of ureter: Secondary | ICD-10-CM | POA: Diagnosis not present

## 2020-04-24 NOTE — Progress Notes (Signed)
04/24/2020 9:22 AM   Albertha Ghee 03/10/1951 [id number]  Referring provider: Wilhelmina Mcardle, MD [address] [place],  [place] [address]  Chief Complaint  Patient presents with  . Nephrolithiasis    HPI: 69 year old male who presents today for surveillance of renal cell carcinoma.  He was taken to the operating room on 03/15/2018 for excision of a 3 cm renal mass. 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 Margins were negative.  He also had an incidental 1.4 cm indeterminate lesion on the mid posterior left kidney. This was not treated at the time of partial nephrectomy.  He returns today with follow-up CT abdomen pelvis with and without contrast performed on 04/06/2020 This shows no evidence of local recurrence.    KUB was not performed today.  He does report he started having some right flank pain about a month ago.  2 weeks later, he passed the right-sided stone which he brings with him today.  Notably, on CT scan the stone previously in the right lower pole was no longer visualized and he had new hydronephrosis presumably from a mid or distal ureteral calculus.  He does have a personal history of stones and passed one in 2019.  He is never required intervention for these.  He has no additional upper tract stones.  He has been trying to drink plenty of water.   PMH: Past Medical History:  Diagnosis Date  . Abnormal nuclear stress test 01/05/2018   6/13:DRH:distal lad ischemia, ef normal  . Anemia 01/05/2018  . Cancer ([place])   . Coronary artery disease   . GERD (gastroesophageal reflux disease) 01/05/2018  . Hemorrhoids 01/05/2018  . History of PTCA 01/05/2018   6/13:DES to LAD.3.5 x 28mm Expedition. RCA dominant mild to moderate. Circ LM ok.  . Hyperlipidemia 09/29/2012  . Hypertension   . Hypomagnesemia 01/05/2018  . Right ureteral stone 01/05/2018  . Status post percutaneous transluminal coronary angioplasty 09/29/2012    6/13:DES to LAD.3.5 x 35mm Expedition. RCA dominant mild to moderate. Circ LM ok.    Surgical History: Past Surgical History:  Procedure Laterality Date  . CORONARY ANGIOPLASTY WITH STENT PLACEMENT    . OPERATIVE ULTRASOUND N/A 03/15/2018   Procedure: OPERATIVE ULTRASOUND;  Surgeon: Hollice Espy, MD;  Location: [facility] ORS;  Service: Urology;  Laterality: N/A;  . ROBOTIC ASSITED PARTIAL NEPHRECTOMY Left 03/15/2018   Procedure: ROBOTIC ASSITED PARTIAL NEPHRECTOMY;  Surgeon: Hollice Espy, MD;  Location: [facility] ORS;  Service: Urology;  Laterality: Left;    Home Medications:  Allergies as of 04/24/2020   No Known Allergies     Medication List       Accurate as of April 24, 2020  9:22 AM. If you have any questions, ask your nurse or doctor.        aspirin EC 81 MG tablet Take 81 mg by mouth daily.   atorvastatin 10 MG tablet Commonly known as: LIPITOR Take 10 mg by mouth every evening.   Clear Eyes Redness Relief 0.012-0.2 % Soln Generic drug: naphazoline-glycerin Place 1 drop into both eyes 4 (four) times daily as needed for eye irritation.   metoprolol succinate 25 MG 24 hr tablet Commonly known as: TOPROL-XL Take 25 mg by mouth every evening.   multivitamin with minerals Tabs tablet Take 1 tablet by mouth daily.   naproxen sodium 220 MG tablet Commonly known as: ALEVE Take 220 mg by mouth daily as needed.       Allergies: No  Known Allergies  Family History: Family History  Problem Relation Age of Onset  . Kidney failure Mother     Social History:  reports that he has never smoked. He has never used smokeless tobacco. He reports current alcohol use. He reports that he does not use drugs.   Physical Exam: BP (!) 153/92   Pulse 71   Ht 5\' 6"  (1.676 m)   Wt 200 lb (90.7 kg)   BMI 32.28 kg/m   Constitutional:  Alert and oriented, No acute distress. HEENT: [place] AT, moist mucus membranes.  Trachea midline, no masses. Cardiovascular: No clubbing, cyanosis, or  edema. Respiratory: Normal respiratory effort, no increased work of breathing. Skin: No rashes, bruises or suspicious lesions. Neurologic: Grossly intact, no focal deficits, moving all 4 extremities. Psychiatric: Normal mood and affect.  Laboratory Data: Lab Results  Component Value Date   WBC 5.9 04/14/2018   HGB 12.1 (L) 04/14/2018   HCT 36.0 (L) 04/14/2018   MCV 88 04/14/2018   PLT 223 04/14/2018    Lab Results  Component Value Date   CREATININE 1.50 (H) 04/06/2020   Pertinent Imaging: CT abdomen with and without from 04/06/2020 personally reviewed.  Agree with radiologic interpretation as below.   IMPRESSION: 1. Redemonstrated postoperative findings of left midportion partial nephrectomy. No evidence of recurrent soft tissue or suspicious contrast enhancement. No evidence of lymphadenopathy or metastatic disease in the abdomen. 2. There is mild right hydronephrosis and proximal hydroureter with a delayed right nephrogram and no excretion of contrast in the calices. Previously noted small calculi in the right kidney are no longer present. The distal ureter is not included on this examination of the abdomen only. Findings are concerning for obstructing distal calculus. Consider additional imaging of the pelvis. 3. Aortic Atherosclerosis (ICD10-I70.0).  These results will be called to the ordering clinician or representative by the Radiologist Assistant, and communication documented in the PACS or [employer].   Assessment & Plan:    1. Carcinoma, renal cell, left (HCC) pT1 RCC status post partial nephrectomy 2019 No evidence of disease today Surveillance chest x-ray pending Repeat cross-sectional imaging in 1 year for total of 3 years Previously notable indeterminate lesion on the left mid pole nonenhancing, will continue to follow  2. Right ureteral stone Interval stone passage, brought stone with him today which is consistent with previous right lower  pole stone Offered stone analysis, declined this will likely not change management, he will hold onto this and consider stenting in the future if he has recurrent stone disease Encouraged hydration   - CT Abd Wo & W Cm; Future - Chest 1 View; Future   Follow-up in 1 year with CT abdomen with and without/chest x-ray  Hollice Espy, MD  [facility] [address], [place], [place] [address] [phone]

## 2021-04-12 ENCOUNTER — Other Ambulatory Visit: Payer: Self-pay

## 2021-04-12 ENCOUNTER — Ambulatory Visit
Admission: RE | Admit: 2021-04-12 | Discharge: 2021-04-12 | Disposition: A | Discharge status: Home / Self Care | Payer: Medicare PPO | Source: Ambulatory Visit | Attending: Urology | Admitting: Urology

## 2021-04-12 DIAGNOSIS — C642 Malignant neoplasm of left kidney, except renal pelvis: Secondary | ICD-10-CM | POA: Insufficient documentation

## 2021-04-12 LAB — POCT I-STAT CREATININE: Creatinine, Ser: 1.2 mg/dL (ref 0.61–1.24)

## 2021-04-12 MED ORDER — IOHEXOL 300 MG/ML  SOLN
100.0000 mL | Freq: Once | INTRAMUSCULAR | Status: AC | PRN
  Administered 2021-04-12: 100 mL via INTRAVENOUS

## 2021-04-29 ENCOUNTER — Other Ambulatory Visit: Payer: Self-pay

## 2021-04-29 DIAGNOSIS — C642 Malignant neoplasm of left kidney, except renal pelvis: Secondary | ICD-10-CM

## 2021-04-29 NOTE — Progress Notes (Signed)
04/30/21 9:20 AM   Tanner Jackson December 20, 1950 [id number]  Referring provider:  Wilhelmina Mcardle, MD [place],  [place] [address] Chief Complaint  Patient presents with   Carcinoma, renal cell, left [facility])     HPI: Tanner Jackson is a 70 y.o.male with a personal history of renal cell carcinoma and right ureteral stone, who presents today for 1 year follow-up with CT scan and chest X-RAY.   He was taken to the operating room on 03/15/2018 for excision of a 3 cm renal mass.  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  Margins were negative.   He also had an incidental 1.4 cm indeterminate lesion on the mid posterior left kidney.  This was not treated at the time of partial nephrectomy.  04/12/2021 CT abdomen and pelvis revealed Bilateral adrenal glands are unremarkable. Similar postsurgical change of partial interpolar left nephrectomy without evidence enhancing soft tissue in the surgical bed. Unchanged size of the 1.5 x 1 cm left posterior interpolar renal lesion on image 83/8 which demonstrates pre contrast Hounsfield units of 31 and postcontrast Hounsfield units of 52, and is again suspicious for low-grade enhancement possibly reflecting an indolent papillary renal cell carcinoma. Similar mild right hydroureteronephrosis with slightly delayed right renal enhancement and excretion contrast. However, no discrete obstructive etiology is visualized in the abdomen.  Chest X-RAY today was personal reviewed and was shown that left renal lesion unchanged since 2019.   He wonders today if it is wise for him to proceed with a colonoscopy given his history of renal cell carcinoma.  PMH: Past Medical History:  Diagnosis Date   Abnormal nuclear stress test 01/05/2018   6/13:DRH:distal lad ischemia, ef normal   Anemia 01/05/2018   Cancer ([facility])    Coronary artery disease    GERD (gastroesophageal reflux disease) 01/05/2018   Hemorrhoids  01/05/2018   History of PTCA 01/05/2018   6/13:DES to LAD.3.5 x 23mm Expedition. RCA dominant mild to moderate. Circ LM ok.   Hyperlipidemia 09/29/2012   Hypertension    Hypomagnesemia 01/05/2018   Right ureteral stone 01/05/2018   Status post percutaneous transluminal coronary angioplasty 09/29/2012   6/13:DES to LAD.3.5 x 51mm Expedition. RCA dominant mild to moderate. Circ LM ok.    Surgical History: Past Surgical History:  Procedure Laterality Date   CORONARY ANGIOPLASTY WITH STENT PLACEMENT     OPERATIVE ULTRASOUND N/A 03/15/2018   Procedure: OPERATIVE ULTRASOUND;  Surgeon: Hollice Espy, MD;  Location: [facility] ORS;  Service: Urology;  Laterality: N/A;   ROBOTIC ASSITED PARTIAL NEPHRECTOMY Left 03/15/2018   Procedure: ROBOTIC ASSITED PARTIAL NEPHRECTOMY;  Surgeon: Hollice Espy, MD;  Location: [facility] ORS;  Service: Urology;  Laterality: Left;    Home Medications:  Allergies as of 04/30/2021   No Known Allergies      Medication List        Accurate as of April 30, 2021  9:20 AM. If you have any questions, ask your nurse or doctor.          STOP taking these medications    atorvastatin 10 MG tablet Commonly known as: LIPITOR Stopped by: Hollice Espy, MD       TAKE these medications    aspirin EC 81 MG tablet Take 81 mg by mouth daily.   metoprolol succinate 25 MG 24 hr tablet Commonly known as: TOPROL-XL Take 25 mg by mouth every evening.   multivitamin with minerals Tabs tablet Take 1 tablet by  mouth daily.   naphazoline-glycerin 0.012-0.2 % Soln Commonly known as: CLEAR EYES REDNESS Place 1 drop into both eyes 4 (four) times daily as needed for eye irritation.   naproxen sodium 220 MG tablet Commonly known as: ALEVE Take 220 mg by mouth daily as needed.   rosuvastatin 20 MG tablet Commonly known as: CRESTOR Take 20 mg by mouth daily. What changed: Another medication with the same name was removed. Continue taking this medication, and follow the  directions you see here. Changed by: Hollice Espy, MD        Allergies: No Known Allergies  Family History: Family History  Problem Relation Age of Onset   Kidney failure Mother     Social History:  reports that he has never smoked. He has never used smokeless tobacco. He reports current alcohol use. He reports that he does not use drugs.   Physical Exam: BP (!) 162/96   Pulse 73   Ht 5\' 6"  (1.676 m)   Wt 200 lb (90.7 kg)   BMI 32.28 kg/m   Constitutional:  Alert and oriented, No acute distress. HEENT: [place] AT, moist mucus membranes.  Trachea midline, no masses. Cardiovascular: No clubbing, cyanosis, or edema. Respiratory: Normal respiratory effort, no increased work of breathing. Skin: No rashes, bruises or suspicious lesions. Neurologic: Grossly intact, no focal deficits, moving all 4 extremities. Psychiatric: Normal mood and affect.  Laboratory Data: Lab Results  Component Value Date   CREATININE 1.20 04/12/2021    Pertinent Imaging: IMPRESSION: 1. Stable examination status post partial interpolar left nephrectomy without evidence of local recurrence or abdominal metastatic disease. 2. Partially exophytic 1.5 cm left posterior interpolar renal lesion which demonstrates probable low level enhancement but is stable in size dating back to at least January 28, 2018, again suspicious for an indolent renal cell carcinoma. 3. Similar mild right hydroureteronephrosis with slightly delayed right renal enhancement and excretion. However, no discrete obstructive etiology is visualized in the abdomen. Once again, consider additional imaging of the pelvis to assess for obstructive etiology if not previously performed. 4. Aortic Atherosclerosis (ICD10-I70.0).     Electronically Signed   By: Dahlia Bailiff M.D.   On: 04/15/2021 09:03    I have personally reviewed the images and agree with radiologist interpretation.    Assessment & Plan:    History of renal cell  carcinoma  - NED of reoccurring disease , - chest x-ray reading pending will call with results   -3 years without recurrence, no further cross-sectional imaging required per guidelines  2. Left renal mass  - Left renal lesion unchanged since 2019. Benign versus indolent lesion. Will transfer to ultrasound and deescalate imaging modality given its overall stability   Return in 1 year with RUS   I,Kailey Littlejohn,acting as a scribe for Hollice Espy, MD.,have documented all relevant documentation on the behalf of Hollice Espy, MD,as directed by  Hollice Espy, MD while in the presence of Hollice Espy, [place] [address] [place], [place] [address] [phone]

## 2021-04-30 ENCOUNTER — Encounter: Payer: Self-pay | Admitting: Urology

## 2021-04-30 ENCOUNTER — Ambulatory Visit: Payer: Medicare PPO | Admitting: Urology

## 2021-04-30 ENCOUNTER — Ambulatory Visit
Admission: RE | Admit: 2021-04-30 | Discharge: 2021-04-30 | Disposition: A | Discharge status: Home / Self Care | Payer: Medicare PPO | Source: Ambulatory Visit | Attending: Urology | Admitting: Urology

## 2021-04-30 ENCOUNTER — Other Ambulatory Visit: Payer: Self-pay

## 2021-04-30 ENCOUNTER — Ambulatory Visit
Admission: RE | Admit: 2021-04-30 | Discharge: 2021-04-30 | Disposition: A | Discharge status: Home / Self Care | Payer: Medicare PPO | Attending: Urology | Admitting: Urology

## 2021-04-30 VITALS — BP 162/96 | HR 73 | Ht 66.0 in | Wt 200.0 lb

## 2021-04-30 DIAGNOSIS — C642 Malignant neoplasm of left kidney, except renal pelvis: Secondary | ICD-10-CM | POA: Diagnosis not present

## 2021-07-31 IMAGING — CT CT ABDOMEN WO/W CM
3 of 13 series · 10 of 46 positions shown, 16 images · IV contrast (omnipaque)
Comparison: 03/29/2019, 01/28/2018

CLINICAL DATA: Renal cell carcinoma, follow-up left partial
nephrectomy

EXAM:
CT ABDOMEN WITHOUT AND WITH CONTRAST
TECHNIQUE: Multidetector CT imaging of the abdomen was performed following the
standard protocol before and following the bolus administration of
intravenous contrast.
CONTRAST:  100mL OMNIPAQUE IOHEXOL 300 MG/ML  SOLN

[Series 4: coronal without renal without 2.00 cor · coronal · non-contrast · 0.55mm/px · 2 of 157 slices shown, 3 images]
[im 53/157  soft-tissue]
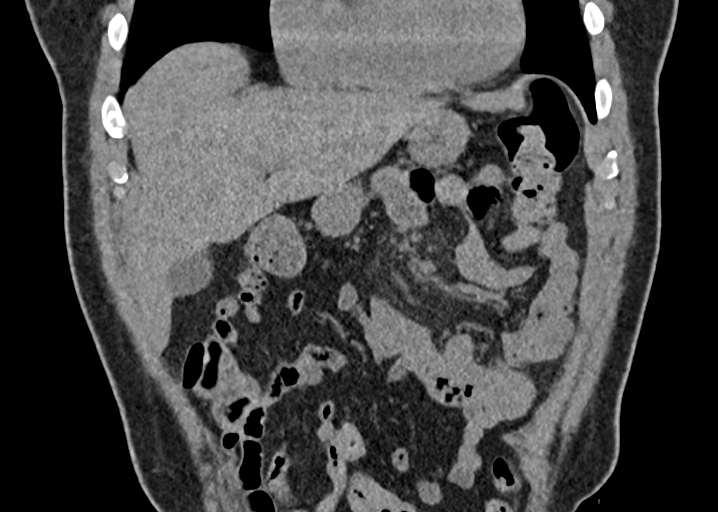
[im 53/157  bone]
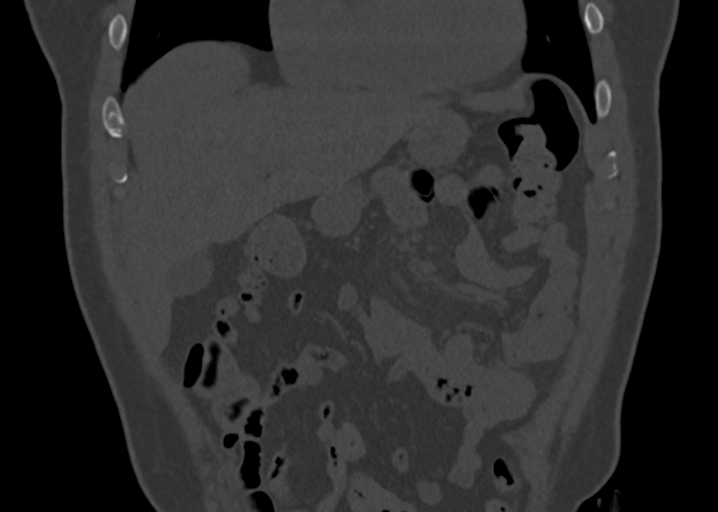
[im 105/157  soft-tissue]
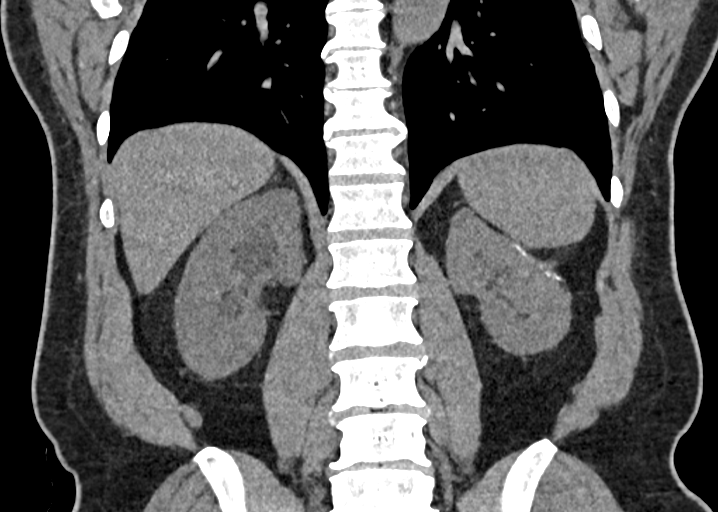

[Series 8: axial arterial renal arterial 2.00 · axial · arterial · 0.77mm/px · z∈[-1262,-1094]mm · 4 of 140 slices shown, 9 images]
[im 28/140  soft-tissue]
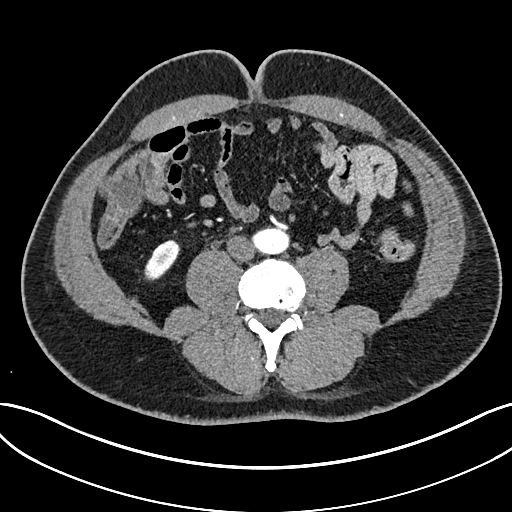
[im 28/140  lung]
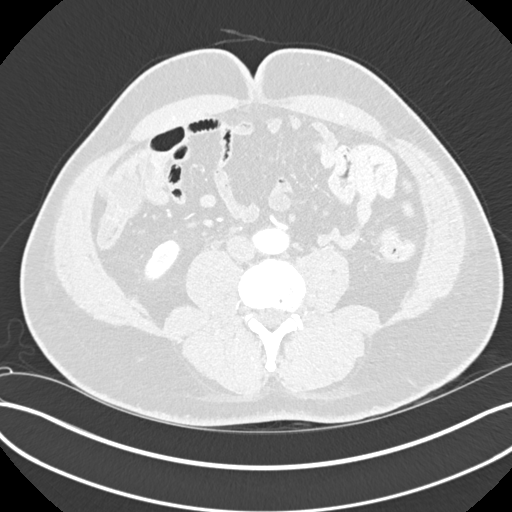
[im 28/140  bone]
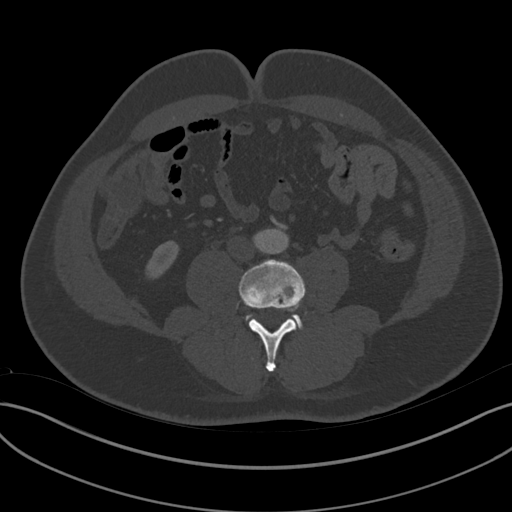
[im 56/140  soft-tissue]
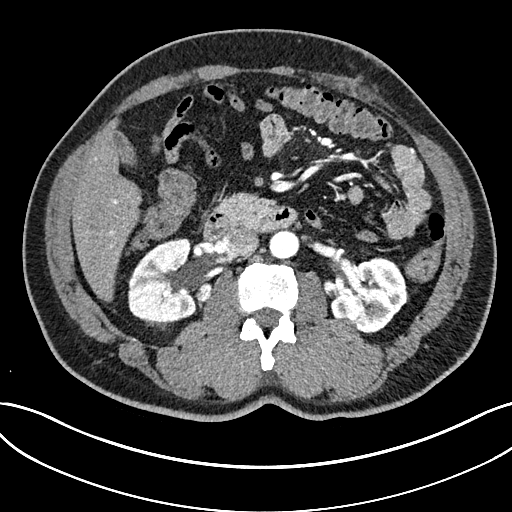
[im 56/140  lung]
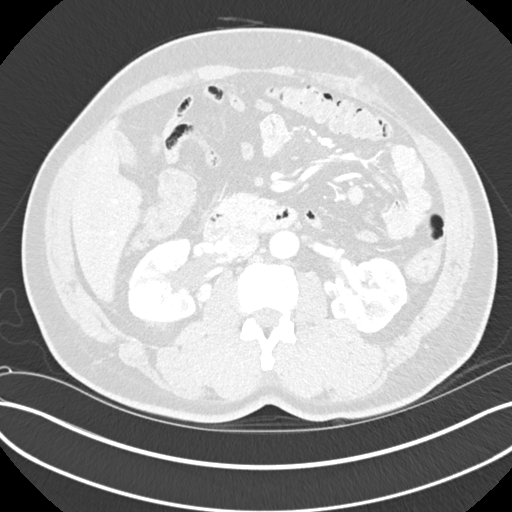
[im 84/140  soft-tissue]
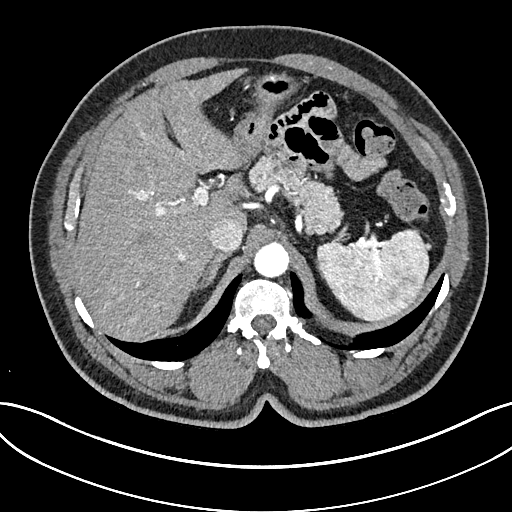
[im 84/140  lung]
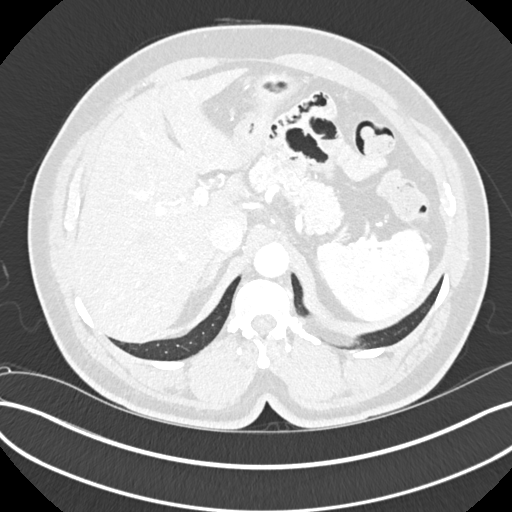
[im 112/140  soft-tissue]
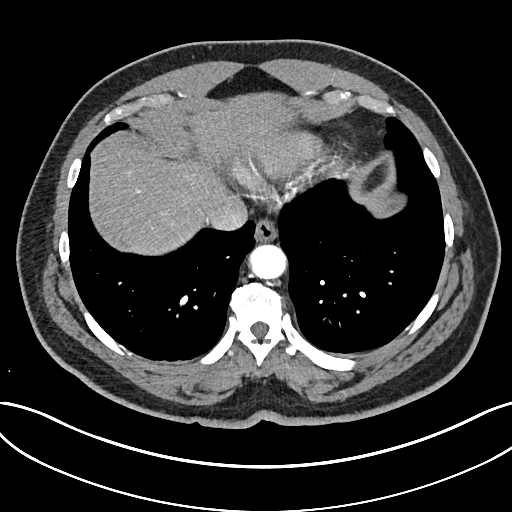
[im 112/140  lung]
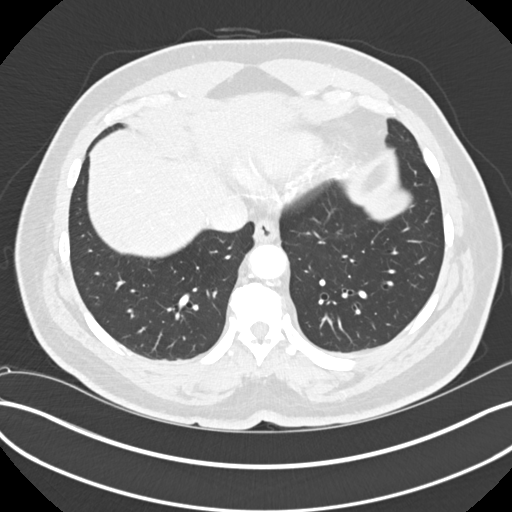

[Series 15: axial nephrographic renal nephrographic 2.00 · axial · 0.77mm/px · z∈[-1262,-1094]mm · 4 of 140 slices shown]
[im 28/140  soft-tissue]
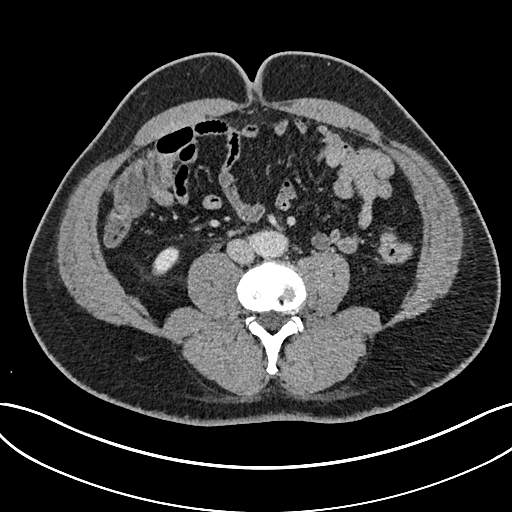
[im 56/140  soft-tissue]
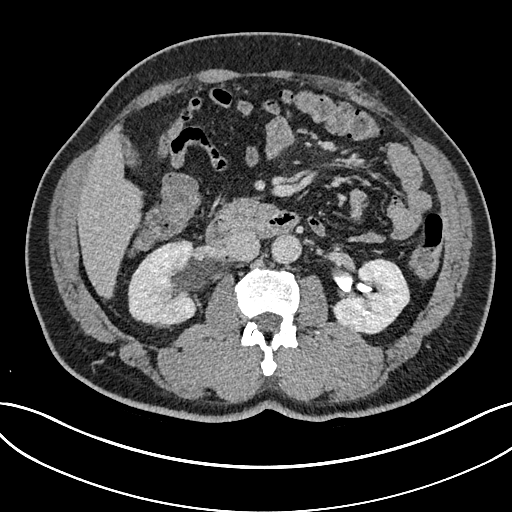
[im 84/140  soft-tissue]
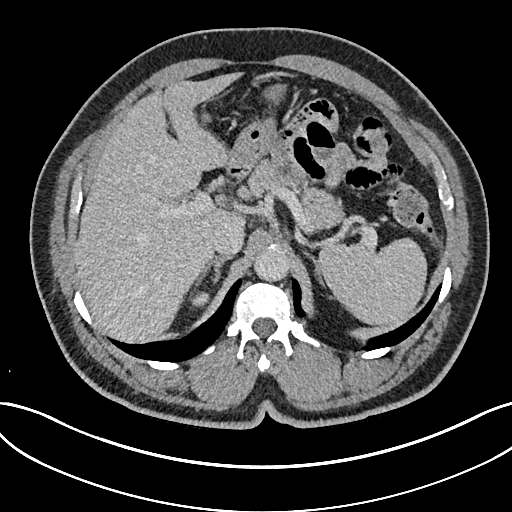
[im 112/140  soft-tissue]
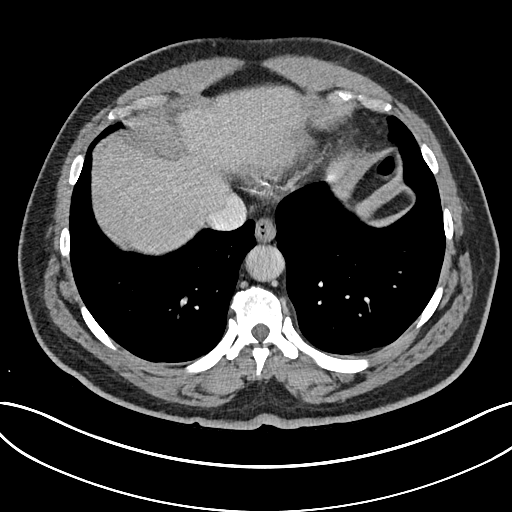

[10 of 46 positions shown; findings below may reference images not displayed]

FINDINGS: Lower chest: No acute abnormality.

Hepatobiliary: No focal liver abnormality is seen. No gallstones,
gallbladder wall thickening, or biliary dilatation.

Pancreas: Unremarkable. No pancreatic ductal dilatation or
surrounding inflammatory changes.

Spleen: Normal in size without focal abnormality.

Adrenals/Urinary Tract: Redemonstrated postoperative findings of
left midportion partial nephrectomy. No evidence of recurrent soft
tissue or suspicious contrast enhancement. There is mild right
hydronephrosis and proximal hydroureter with a delayed right
nephrogram and no excretion of contrast in the calices. Previously
noted small calculi in the right kidney are no longer present. The
distal ureter is not included on this examination of the abdomen
only.

Stomach/Bowel: Stomach is within normal limits. Appendix appears
normal. No evidence of bowel wall thickening, distention, or
inflammatory changes.

Vascular/Lymphatic: Aortic atherosclerosis. No enlarged abdominal
lymph nodes.

Other: No abdominal wall hernia or abnormality.

Musculoskeletal: No acute or significant osseous findings.
IMPRESSION: 1. Redemonstrated postoperative findings of left midportion partial
nephrectomy. No evidence of recurrent soft tissue or suspicious
contrast enhancement. No evidence of lymphadenopathy or metastatic
disease in the abdomen.
2. There is mild right hydronephrosis and proximal hydroureter with
a delayed right nephrogram and no excretion of contrast in the
calices. Previously noted small calculi in the right kidney are no
longer present. The distal ureter is not included on this
examination of the abdomen only. Findings are concerning for
obstructing distal calculus. Consider additional imaging of the
pelvis.
3. Aortic Atherosclerosis ([id number]).

These results will be called to the ordering clinician or
representative by the Radiologist Assistant, and communication
documented in the PACS or [REDACTED].

## 2021-08-18 IMAGING — CR DG CHEST 1V
1 series · 1 of 1 positions shown · non-contrast
Comparison: 04/19/2019

CLINICAL DATA: Renal cell carcinoma diagnosed 2 years ago. Left
nephrectomy. Asymptomatic.

EXAM:
CHEST  1 VIEW

[chest pa]
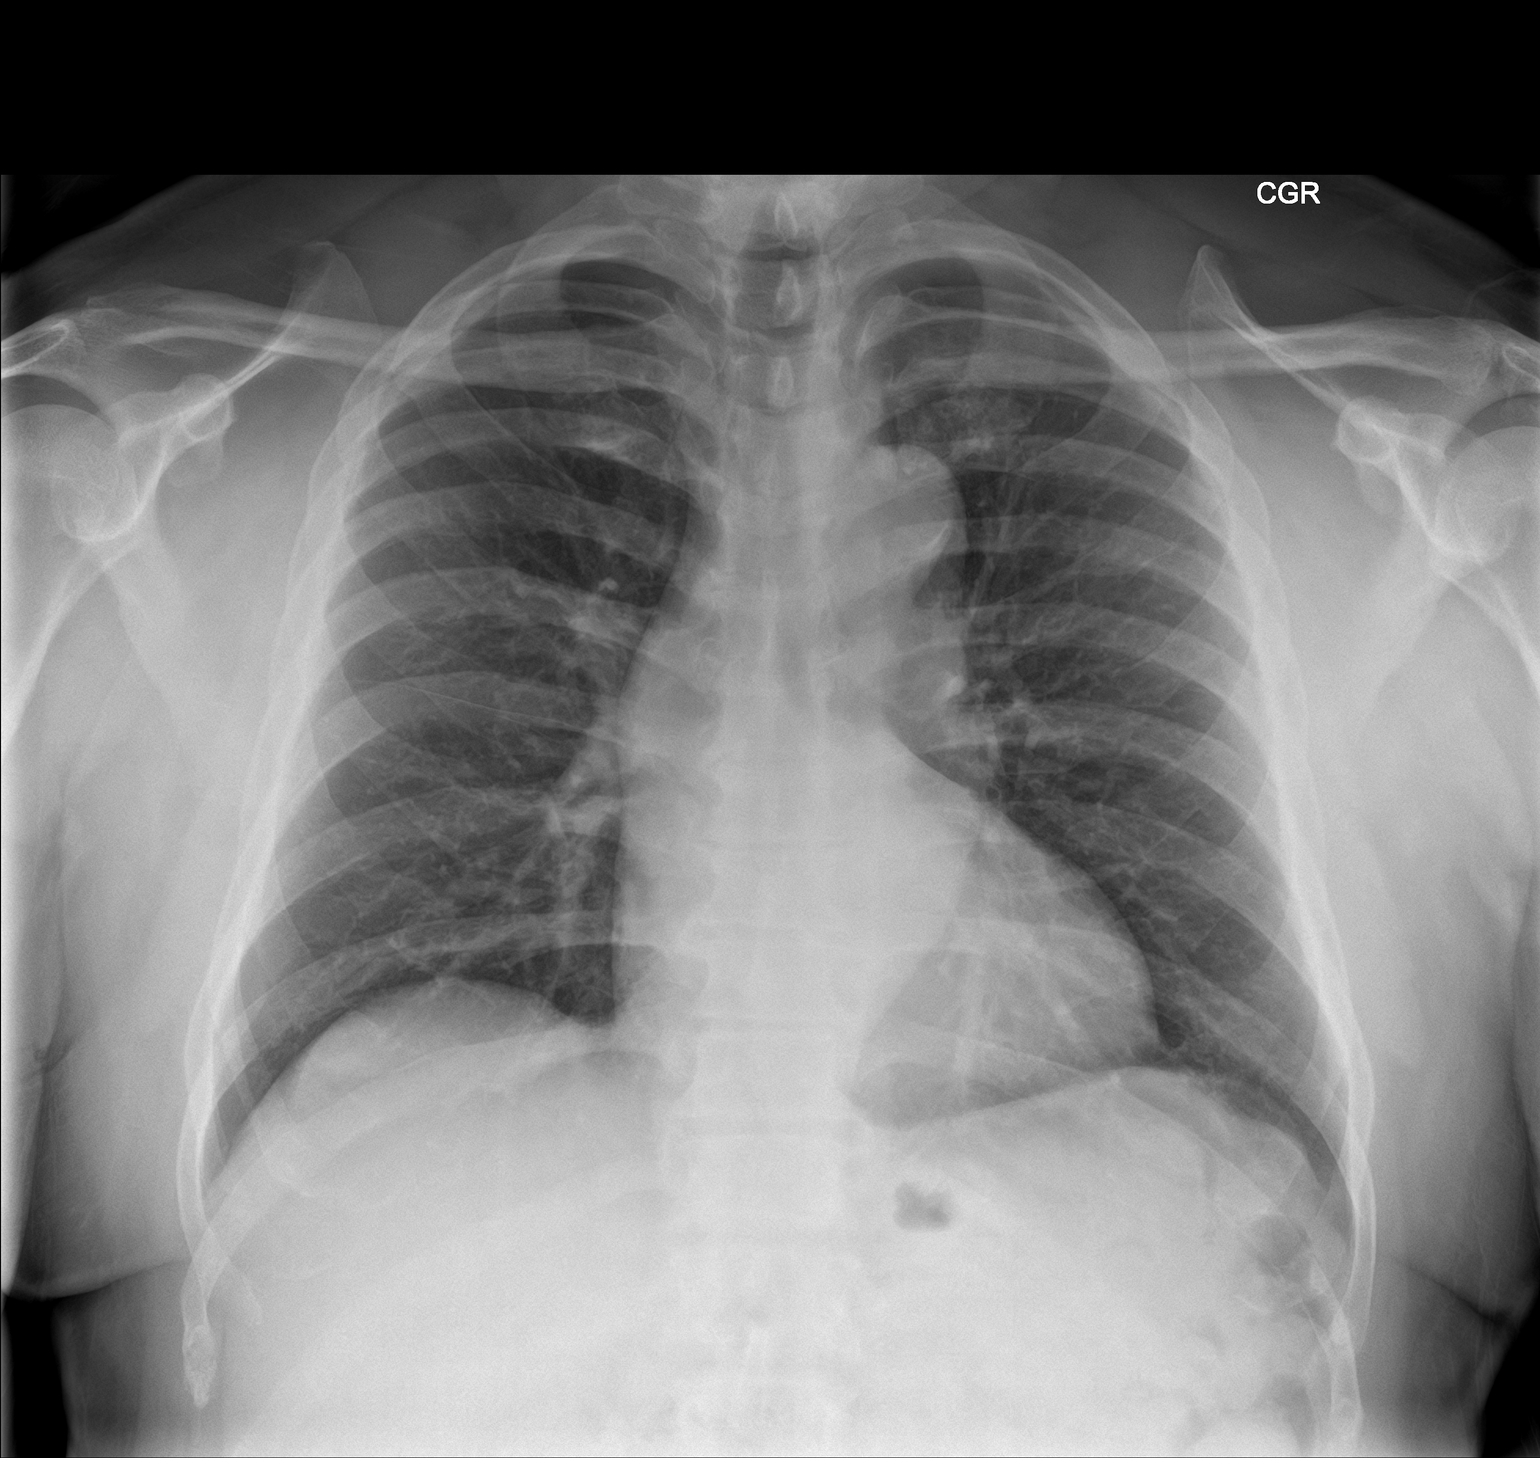

[1 of 1 positions shown; findings below may reference images not displayed]

FINDINGS: Midline trachea. Normal heart size. Tortuous thoracic aorta.
Atherosclerosis in the transverse aorta. No pleural effusion or
pneumothorax. Clear lungs.
IMPRESSION: No evidence of thoracic metastasis.

Aortic Atherosclerosis ([id number]-OME.E).

## 2022-04-10 ENCOUNTER — Ambulatory Visit
Admission: RE | Admit: 2022-04-10 | Discharge: 2022-04-10 | Disposition: A | Discharge status: Home / Self Care | Payer: Medicare PPO | Source: Ambulatory Visit | Attending: Urology | Admitting: Urology

## 2022-04-10 DIAGNOSIS — C642 Malignant neoplasm of left kidney, except renal pelvis: Secondary | ICD-10-CM | POA: Insufficient documentation

## 2022-04-30 ENCOUNTER — Ambulatory Visit: Payer: Medicare PPO | Admitting: Urology

## 2022-04-30 ENCOUNTER — Encounter: Payer: Self-pay | Admitting: Urology

## 2022-04-30 VITALS — BP 138/85 | HR 64 | Ht 66.0 in | Wt 200.0 lb

## 2022-04-30 DIAGNOSIS — C642 Malignant neoplasm of left kidney, except renal pelvis: Secondary | ICD-10-CM

## 2022-04-30 DIAGNOSIS — Z85528 Personal history of other malignant neoplasm of kidney: Secondary | ICD-10-CM

## 2022-04-30 NOTE — Progress Notes (Signed)
04/30/2022 9:04 AM   Tanner Jackson 1950-08-12 [id number]  Referring provider: Wilhelmina Mcardle, MD No address on file  Chief Complaint  Patient presents with   Follow-up    HPI: 71 year old male who presents today for annual follow-up with renal ultrasound.  He was taken to the operating room on 03/15/2018 for excision of a 3 cm renal mass.  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  Margins were negative.   He also had an incidental 1.4 cm indeterminate lesion on the mid posterior left kidney.  This was not treated at the time of partial nephrectomy.  He returns today with follow-up renal ultrasound, primarily to assess the unchanged left-sided renal lesion which has been stable.  Renal ultrasound was completed on 04/13/2022, unfortunately renal lesion in question is not appreciated on this study.  He denies any flank pain.  He is lost 30 pounds this year and is feeling great.  He is also brought down his hemoglobin A1c with just diet and exercise alone.  PMH: Past Medical History:  Diagnosis Date   Abnormal nuclear stress test 01/05/2018   6/13:DRH:distal lad ischemia, ef normal   Anemia 01/05/2018   Cancer ([facility])    Coronary artery disease    GERD (gastroesophageal reflux disease) 01/05/2018   Hemorrhoids 01/05/2018   History of PTCA 01/05/2018   6/13:DES to LAD.3.5 x 74m Expedition. RCA dominant mild to moderate. Circ LM ok.   Hyperlipidemia 09/29/2012   Hypertension    Hypomagnesemia 01/05/2018   Right ureteral stone 01/05/2018   Status post percutaneous transluminal coronary angioplasty 09/29/2012   6/13:DES to LAD.3.5 x 175mExpedition. RCA dominant mild to moderate. Circ LM ok.    Surgical History: Past Surgical History:  Procedure Laterality Date   CORONARY ANGIOPLASTY WITH STENT PLACEMENT     OPERATIVE ULTRASOUND N/A 03/15/2018   Procedure: OPERATIVE ULTRASOUND;  Surgeon: BrHollice EspyMD;  Location: [facility] ORS;  Service:  Urology;  Laterality: N/A;   ROBOTIC ASSITED PARTIAL NEPHRECTOMY Left 03/15/2018   Procedure: ROBOTIC ASSITED PARTIAL NEPHRECTOMY;  Surgeon: BrHollice EspyMD;  Location: [facility] ORS;  Service: Urology;  Laterality: Left;    Home Medications:  Allergies as of 04/30/2022   No Known Allergies      Medication List        Accurate as of April 30, 2022  9:04 AM. If you have any questions, ask your nurse or doctor.          aspirin EC 81 MG tablet Take 81 mg by mouth daily.   metoprolol succinate 25 MG 24 hr tablet Commonly known as: TOPROL-XL Take 25 mg by mouth every evening.   multivitamin with minerals Tabs tablet Take 1 tablet by mouth daily.   naphazoline-glycerin 0.012-0.2 % Soln Commonly known as: CLEAR EYES REDNESS Place 1 drop into both eyes 4 (four) times daily as needed for eye irritation.   naproxen sodium 220 MG tablet Commonly known as: ALEVE Take 220 mg by mouth daily as needed.   rosuvastatin 20 MG tablet Commonly known as: CRESTOR Take 20 mg by mouth daily.        Allergies: No Known Allergies  Family History: Family History  Problem Relation Age of Onset   Kidney failure Mother     Social History:  reports that he has never smoked. He has never used smokeless tobacco. He reports current alcohol use. He reports that he does not use drugs.   Physical Exam: BP 138/85  Pulse 64   Ht '5\' 6"'$  (1.676 m)   Wt 200 lb (90.7 kg)   BMI 32.28 kg/m   Constitutional:  Alert and oriented, No acute distress. HEENT: [place] AT, moist mucus membranes.  Trachea midline, no masses. Cardiovascular: No clubbing, cyanosis, or edema. Respiratory: Normal respiratory effort, no increased work of breathing. Neurologic: Grossly intact, no focal deficits, moving all 4 extremities. Psychiatric: Normal mood and affect.  Laboratory Data: Lab Results  Component Value Date   WBC 5.9 04/14/2018   HGB 12.1 (L) 04/14/2018   HCT 36.0 (L) 04/14/2018   MCV 88 04/14/2018    PLT 223 04/14/2018    Lab Results  Component Value Date   CREATININE 1.20 04/12/2021    Pertinent Imaging: Ultrasound renal complete  Narrative CLINICAL DATA:  History of a partial nephrectomy on the left due to renal cell carcinoma.  EXAM: RENAL / URINARY TRACT ULTRASOUND COMPLETE  COMPARISON:  CT, 04/12/2021.  FINDINGS: Right Kidney:  Renal measurements: 10.8 x 5 x 5.8 cm = volume: 164 mL. Echogenicity within normal limits. No mass or hydronephrosis visualized.  Left Kidney:  Renal measurements: 10.8 x 6.3 x 6.1 cm = volume: 216 mL. Echogenicity within normal limits. No mass or hydronephrosis visualized. Post nephrectomy changes are not defined on ultrasound.  Bladder:  Appears normal for degree of bladder distention.  Other:  None.  IMPRESSION: 1. No acute findings.  No hydronephrosis. 2. Normal sonographic appearance of the kidneys. No renal mass to suggest hepatocellular carcinoma.   Electronically Signed By: Lajean Manes M.D. On: 04/13/2022 16:06  Renal ultrasound personally reviewed, agree with radiologic interpretation.  There is no obvious lesion seen on the left side.  Assessment & Plan:    1. Carcinoma, renal cell, left ([place]) Status post partial nephrectomy without recurrence  Indeterminate 1.5 cm left renal lesion of the left posterior midpole not appreciated on this particular renal ultrasound.  That somewhat reassuring as its not likely increasing in size or becoming more solid in nature.  Given his history of renal cell carcinoma on the side as well as a questionable indeterminate lesion, I recommended continuation of serial imaging.  We will plan for renal ultrasound again next year, and consider potentially CT of the year thereafter. - US RENAL; Future   Return in about 1 year (around 05/01/2023) for RUS.  Hollice Espy, MD  [facility] [address] [place], [place] [address] [phone]

## 2022-08-06 IMAGING — CT CT ABDOMEN WO/W CM
3 of 13 series · 10 of 46 positions shown, 16 images · IV contrast (omnipaque)
Comparison: Multiple priors including most recent CT April 06, 2020

CLINICAL DATA: History of RCC status post partial left nephrectomy,
follow-up.

EXAM:
CT ABDOMEN WITHOUT AND WITH CONTRAST
TECHNIQUE: Multidetector CT imaging of the abdomen was performed following the
standard protocol before and following the bolus administration of
intravenous contrast.
CONTRAST:  100mL OMNIPAQUE IOHEXOL 300 MG/ML  SOLN

[Series 4: coronal without renal without 2.00 cor · coronal · non-contrast · 0.52mm/px · 2 of 157 slices shown, 3 images]
[im 53/157  soft-tissue]
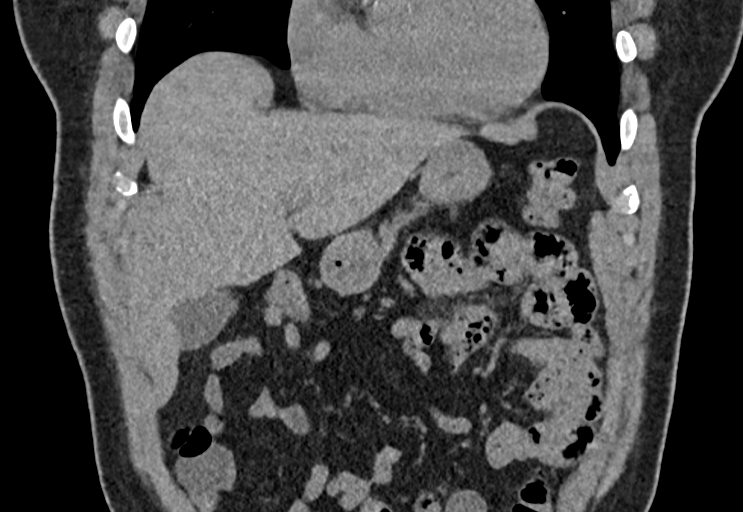
[im 53/157  bone]
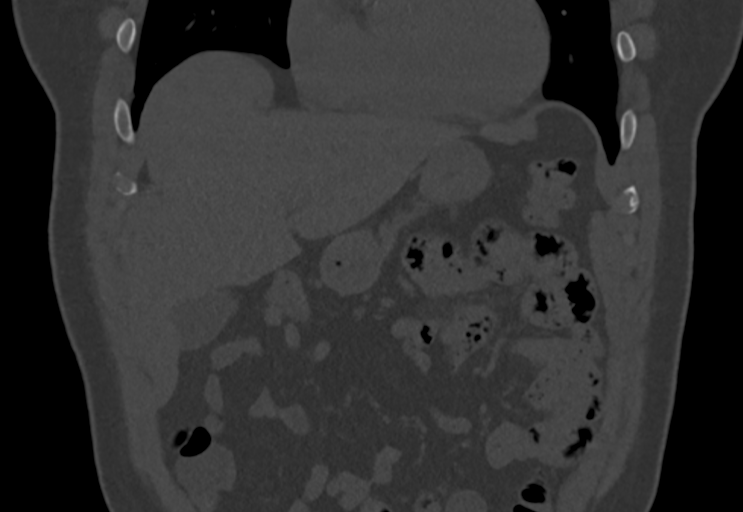
[im 105/157  soft-tissue]
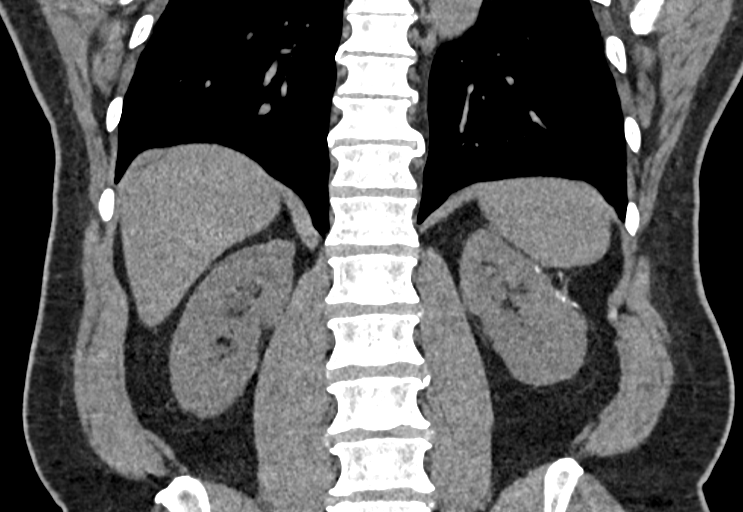

[Series 8: axial arterial renal arterial 2.00 · axial · arterial · 0.76mm/px · z∈[-1232,-1072]mm · 4 of 134 slices shown, 9 images]
[im 27/134  soft-tissue]
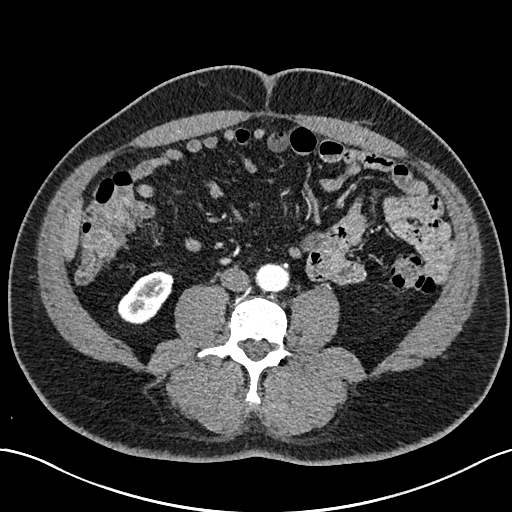
[im 27/134  lung]
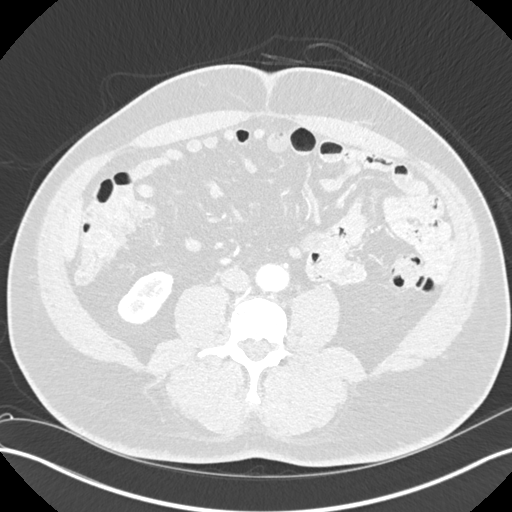
[im 27/134  bone]
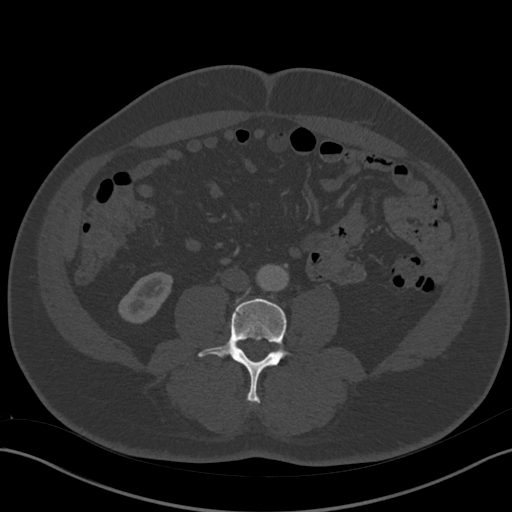
[im 54/134  soft-tissue]
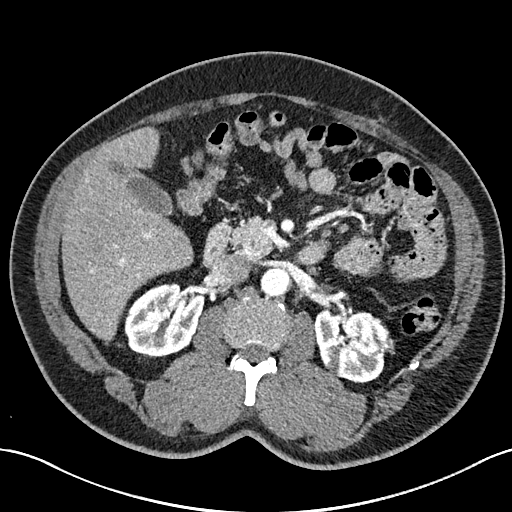
[im 54/134  lung]
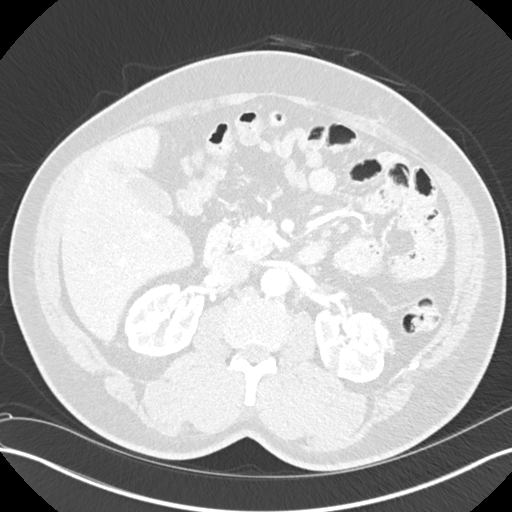
[im 80/134  soft-tissue]
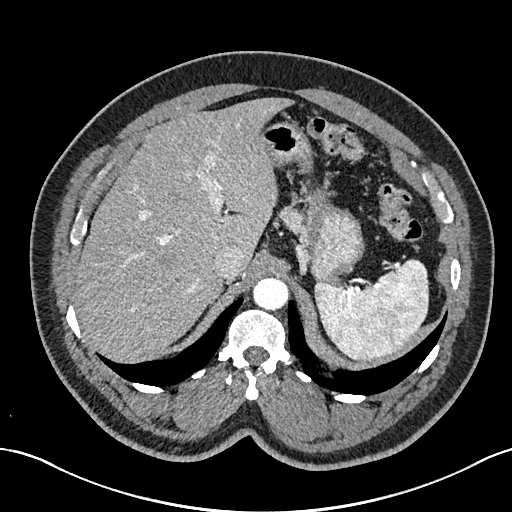
[im 80/134  lung]
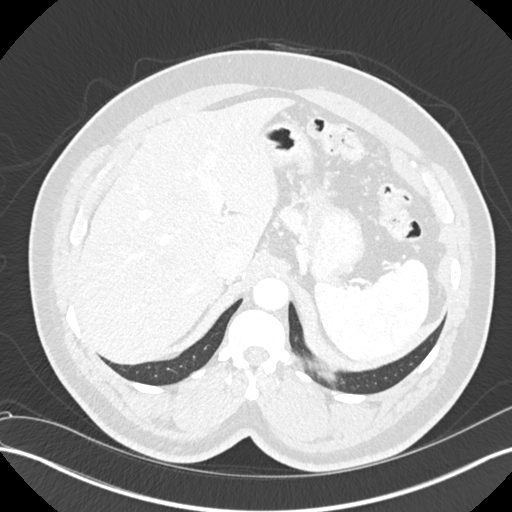
[im 107/134  soft-tissue]
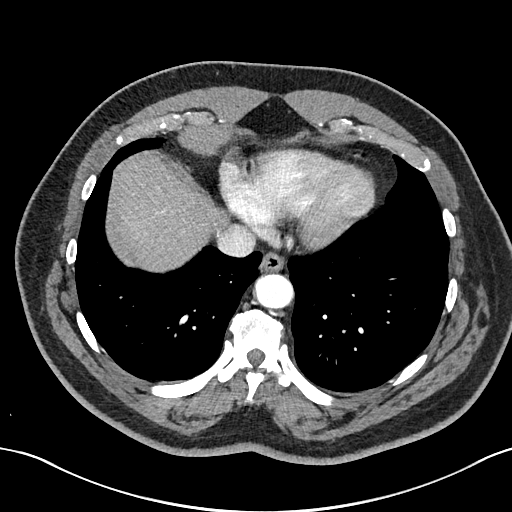
[im 107/134  lung]
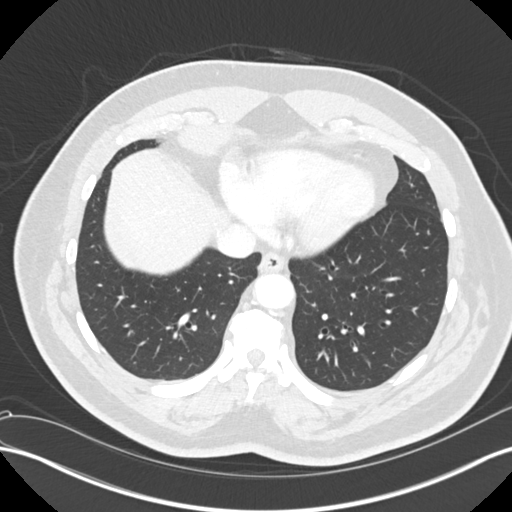

[Series 15: axial nephrographic renal nephrographic 2.00 · axial · 0.76mm/px · z∈[-1232,-1072]mm · 4 of 134 slices shown]
[im 27/134  soft-tissue]
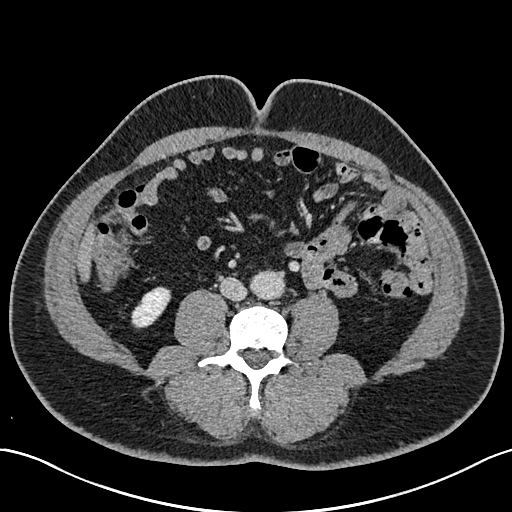
[im 54/134  soft-tissue]
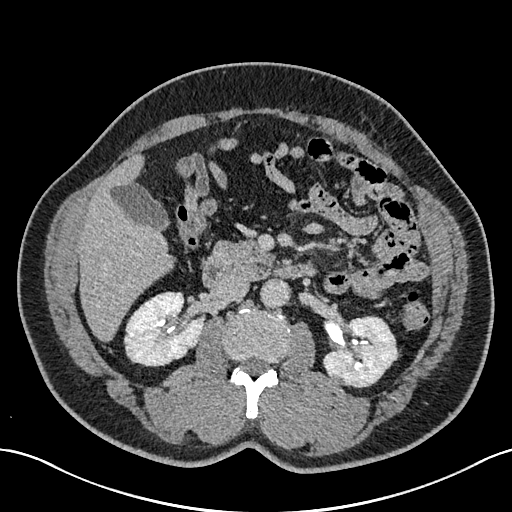
[im 80/134  soft-tissue]
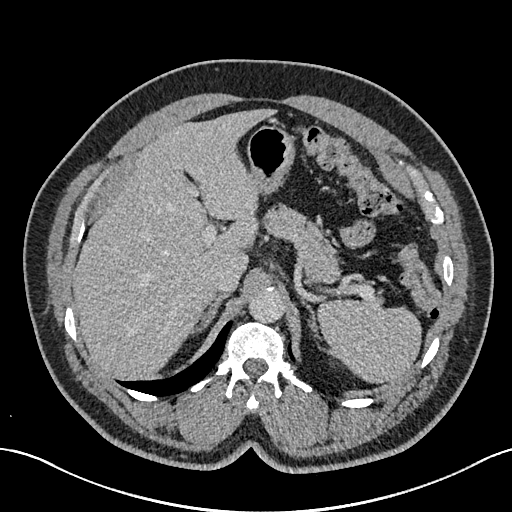
[im 107/134  soft-tissue]
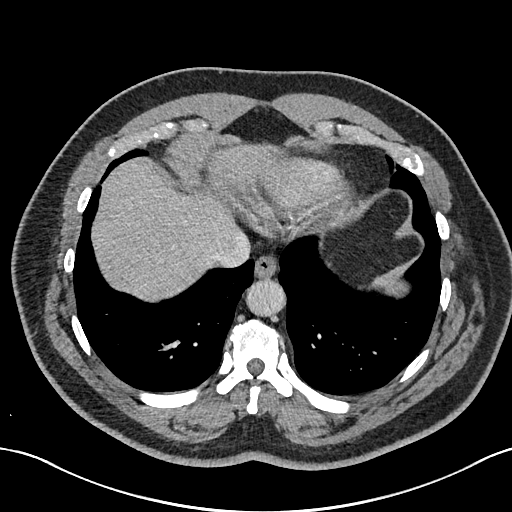

[10 of 46 positions shown; findings below may reference images not displayed]

FINDINGS: Lower chest: No acute abnormality.

Hepatobiliary: No suspicious hepatic lesion. Dystrophic
calcification in the dome of the liver, sequela prior
infection/inflammation. Gallbladder is unremarkable. No biliary
ductal dilation.

Pancreas: Fatty invagination in the body and neck of the pancreas.
No pancreatic ductal dilation.

Spleen: Normal in size without focal abnormality.

Adrenals/Urinary Tract: Bilateral adrenal glands are unremarkable.

Similar postsurgical change of partial interpolar left nephrectomy
without evidence enhancing soft tissue in the surgical bed.

Unchanged size of the 1.5 x 1 cm left posterior interpolar renal
lesion on image 83/8 which demonstrates pre contrast Hounsfield
units of 31 and postcontrast Hounsfield units of 52, and is again
suspicious for low-grade enhancement possibly reflecting an indolent
papillary renal cell carcinoma.

Similar mild right hydroureteronephrosis with slightly delayed right
renal enhancement and excretion contrast. However, no discrete
obstructive etiology is visualized in the abdomen.

Stomach/Bowel: Stomach is within normal limits. Visualized portions
of the appendix appears normal. No evidence of bowel wall
thickening, distention, or inflammatory changes.

Vascular/Lymphatic: Aortic atherosclerosis without abdominal aortic
aneurysm. Renal veins are patent without intraluminal filling
defects. No pathologically enlarged abdominal lymph nodes.

Other: No abdominal ascites.

Musculoskeletal: No suspicious lytic or blastic lesion of bone.
IMPRESSION: 1. Stable examination status post partial interpolar left
nephrectomy without evidence of local recurrence or abdominal
metastatic disease.
2. Partially exophytic 1.5 cm left posterior interpolar renal lesion
which demonstrates probable low level enhancement but is stable in
size dating back to at least January 28, 2018, again suspicious for
an indolent renal cell carcinoma.
3. Similar mild right hydroureteronephrosis with slightly delayed
right renal enhancement and excretion. However, no discrete
obstructive etiology is visualized in the abdomen. Once again,
consider additional imaging of the pelvis to assess for obstructive
etiology if not previously performed.
4. Aortic Atherosclerosis ([id number]).

## 2022-08-24 IMAGING — CR DG CHEST 1V
1 series · 1 of 1 positions shown · non-contrast
Comparison: April 24, 2020.

CLINICAL DATA: History of left renal cell carcinoma.

EXAM:
CHEST  1 VIEW

[dg chest 1 view]
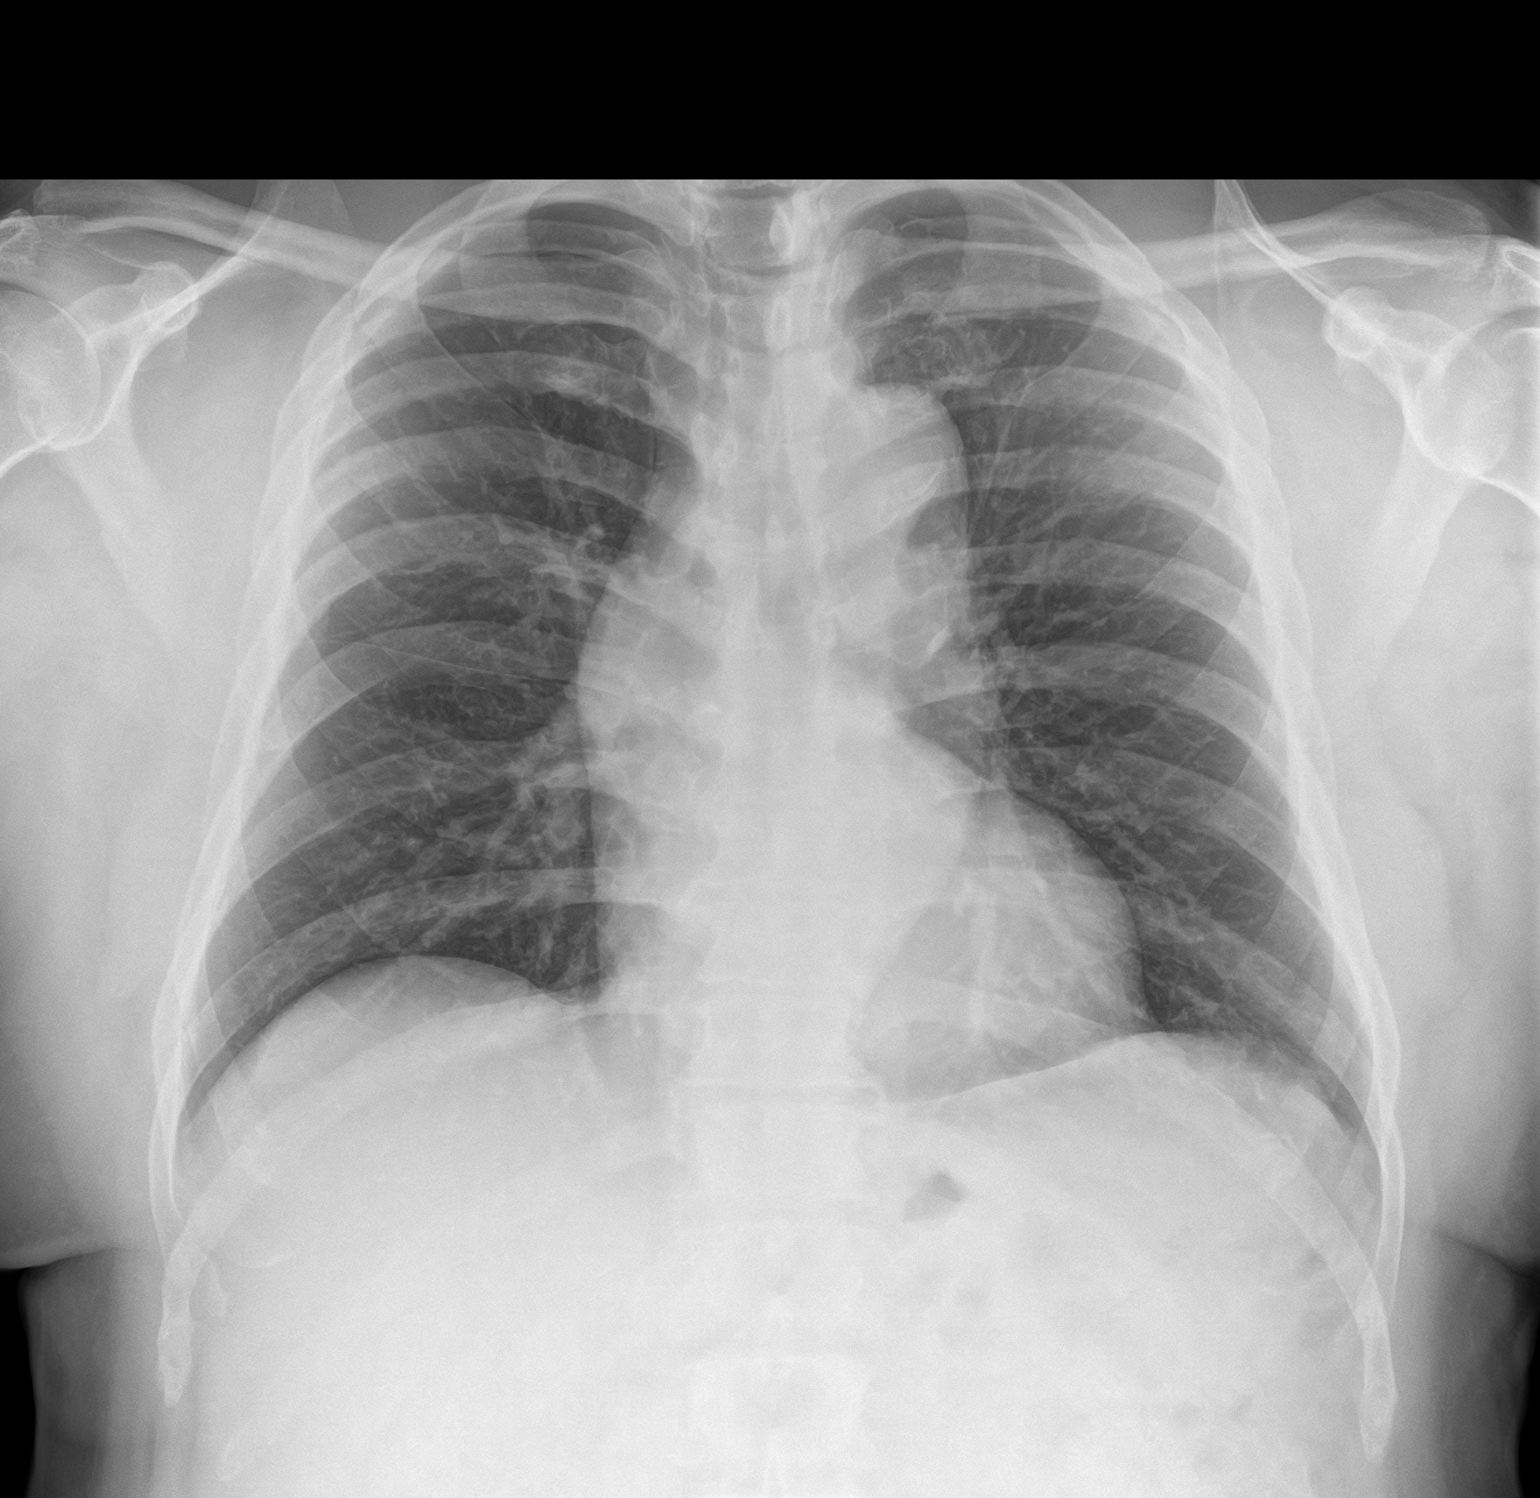

[1 of 1 positions shown; findings below may reference images not displayed]

FINDINGS: The heart size and mediastinal contours are within normal limits.
Both lungs are clear. The visualized skeletal structures are
unremarkable.
IMPRESSION: No active disease.

## 2023-04-27 ENCOUNTER — Ambulatory Visit
Admission: RE | Admit: 2023-04-27 | Discharge: 2023-04-27 | Disposition: A | Discharge status: Home / Self Care | Payer: Medicare PPO | Source: Ambulatory Visit | Attending: Urology | Admitting: Urology

## 2023-04-27 DIAGNOSIS — C642 Malignant neoplasm of left kidney, except renal pelvis: Secondary | ICD-10-CM | POA: Insufficient documentation

## 2023-04-28 ENCOUNTER — Ambulatory Visit: Payer: Medicare PPO | Admitting: Urology

## 2023-04-28 ENCOUNTER — Encounter: Payer: Self-pay | Admitting: Urology

## 2023-04-28 VITALS — BP 148/78 | HR 65 | Ht 66.0 in | Wt 192.0 lb

## 2023-04-28 DIAGNOSIS — N2889 Other specified disorders of kidney and ureter: Secondary | ICD-10-CM | POA: Diagnosis not present

## 2023-04-28 NOTE — Progress Notes (Signed)
Marcelle Overlie Plume,acting as a scribe for Vanna Scotland, MD.,have documented all relevant documentation on the behalf of Vanna Scotland, MD,as directed by  Vanna Scotland, MD while in the presence of Vanna Scotland, MD.   04/28/2023 10:11 AM   Tanner Jackson Feb 15, 1951 [id number]  Referring provider: Dan Maker, MD No address on file  Chief Complaint  Patient presents with   Nephrolithiasis    HPI: 72 year-old male who returns today for annual follow up.   He was taken to the operating room on 03/15/2018 for excision of a 3 cm renal mass.  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  Margins were negative.   He also had an incidental 1.4 cm indeterminate lesion on the mid posterior left kidney.  This was not treated at the time of partial nephrectomy.  On the renal ultrasound from last year, the lesion could not be clearly visualized. We follow this up again with another renal ultrasound. I personally reviewed this and it has not been interpreted by radiology. The lesion is once again hard to visualize, which is reassuring.   PMH: Past Medical History:  Diagnosis Date   Abnormal nuclear stress test 01/05/2018   6/13:DRH:distal lad ischemia, ef normal   Anemia 01/05/2018   Cancer (HCC)    Coronary artery disease    GERD (gastroesophageal reflux disease) 01/05/2018   Hemorrhoids 01/05/2018   History of PTCA 01/05/2018   6/13:DES to LAD.3.5 x 18mm Expedition. RCA dominant mild to moderate. Circ LM ok.   Hyperlipidemia 09/29/2012   Hypertension    Hypomagnesemia 01/05/2018   Right ureteral stone 01/05/2018   Status post percutaneous transluminal coronary angioplasty 09/29/2012   6/13:DES to LAD.3.5 x 18mm Expedition. RCA dominant mild to moderate. Circ LM ok.    Surgical History: Past Surgical History:  Procedure Laterality Date   CORONARY ANGIOPLASTY WITH STENT PLACEMENT     OPERATIVE ULTRASOUND N/A 03/15/2018   Procedure:  OPERATIVE ULTRASOUND;  Surgeon: Vanna Scotland, MD;  Location: [facility];  Service: Urology;  Laterality: N/A;   ROBOTIC ASSITED PARTIAL NEPHRECTOMY Left 03/15/2018   Procedure: ROBOTIC ASSITED PARTIAL NEPHRECTOMY;  Surgeon: Vanna Scotland, MD;  Location: [facility];  Service: Urology;  Laterality: Left;    Home Medications:  Allergies as of 04/28/2023   No Known Allergies      Medication List        Accurate as of April 28, 2023 10:11 AM. If you have any questions, ask your nurse or doctor.          aspirin EC 81 MG tablet Take 81 mg by mouth daily.   metoprolol succinate 25 MG 24 hr tablet Commonly known as: TOPROL-XL Take 25 mg by mouth every evening.   multivitamin with minerals Tabs tablet Take 1 tablet by mouth daily.   naphazoline-glycerin 0.012-0.2 % Soln Commonly known as: CLEAR EYES REDNESS Place 1 drop into both eyes 4 (four) times daily as needed for eye irritation.   naproxen sodium 220 MG tablet Commonly known as: ALEVE Take 220 mg by mouth daily as needed.   rosuvastatin 20 MG tablet Commonly known as: CRESTOR Take 20 mg by mouth daily.        Family History: Family History  Problem Relation Age of Onset   Kidney failure Mother     Social History:  reports that he has never smoked. He has never used smokeless tobacco. He reports current alcohol use. He reports that he does  not use drugs.   Physical Exam: BP (!) 148/78   Pulse 65   Ht 5\' 6"  (1.676 m)   Wt 192 lb (87.1 kg)   BMI 30.99 kg/m   Constitutional:  Alert and oriented, No acute distress. HEENT:  AT, moist mucus membranes.  Trachea midline, no masses. Neurologic: Grossly intact, no focal deficits, moving all 4 extremities. Psychiatric: Normal mood and affect.  Pertinent Imaging:  Renal ultrasound interpretation is pending.  I personally reviewed this today. The lesion is once again hard to visualize, which is reassuring.   Assessment & Plan:    1. Left renal lesion -  Radiologic interpretation is pending. Lesion remains difficult to visualize, which is reassuring that it has not turned into a more solid or well-defined mass.  - I do think it is likely reasonable at this point in time to reassess with MR and compare it to his MR 5 years ago. If it is unchanged, we can likely defer further imaging. If it is increased in size or has more concerning characteristics, could consider additional therapy  - Await MRI results and radiology interpretation. Contact him once results are available to discuss further management.   Return in about 4 weeks (around 05/26/2023) for discussion of MR results and treatment plan.  I have reviewed the above documentation for accuracy and completeness, and I agree with the above.   Vanna Scotland, MD    [facility] [address], [address] [place], [place] [address] [phone]

## 2023-05-04 ENCOUNTER — Ambulatory Visit
Admission: RE | Admit: 2023-05-04 | Discharge: 2023-05-04 | Disposition: A | Discharge status: Home / Self Care | Payer: Medicare PPO | Source: Ambulatory Visit | Attending: Urology | Admitting: Urology

## 2023-05-04 DIAGNOSIS — N2889 Other specified disorders of kidney and ureter: Secondary | ICD-10-CM | POA: Insufficient documentation

## 2023-05-04 MED ORDER — GADOBUTROL 1 MMOL/ML IV SOLN
8.0000 mL | Freq: Once | INTRAVENOUS | Status: AC | PRN
  Administered 2023-05-04: 8 mL via INTRAVENOUS

## 2023-05-20 ENCOUNTER — Ambulatory Visit: Payer: Medicare PPO | Admitting: Urology

## 2023-05-20 VITALS — BP 167/84 | HR 71 | Ht 66.0 in | Wt 192.0 lb

## 2023-05-20 DIAGNOSIS — N2889 Other specified disorders of kidney and ureter: Secondary | ICD-10-CM | POA: Diagnosis not present

## 2023-05-20 NOTE — Progress Notes (Signed)
Marcelle Overlie Plume,acting as a scribe for Vanna Scotland, MD.,have documented all relevant documentation on the behalf of Vanna Scotland, MD,as directed by  Vanna Scotland, MD while in the presence of Vanna Scotland, MD.  05/21/23 3:04 PM   Tanner Jackson 1950-09-29 [id number]  Referring provider: Dan Maker, MD No address on file  Chief Complaint  Patient presents with   Results    HPI: 72 year-old male who follows up today with an MRI of his kidneys.  He was taken to the operating room on 03/15/2018 for excision of a 3 cm renal mass.  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  Margins were negative.   He also had an incidental 1.4 cm indeterminate lesion on the mid posterior left kidney.  This was not treated at the time of partial nephrectomy.  The lesion on the posterior mid portion remains unchanged at 1.5 cm. He also had a renal ultrasound that did not demonstrate the lesion so unfortunately, we are not going to be able to follow the lesion with renal ultrasound.    PMH: Past Medical History:  Diagnosis Date   Abnormal nuclear stress test 01/05/2018   6/13:DRH:distal lad ischemia, ef normal   Anemia 01/05/2018   Cancer (HCC)    Coronary artery disease    GERD (gastroesophageal reflux disease) 01/05/2018   Hemorrhoids 01/05/2018   History of PTCA 01/05/2018   6/13:DES to LAD.3.5 x 18mm Expedition. RCA dominant mild to moderate. Circ LM ok.   Hyperlipidemia 09/29/2012   Hypertension    Hypomagnesemia 01/05/2018   Right ureteral stone 01/05/2018   Status post percutaneous transluminal coronary angioplasty 09/29/2012   6/13:DES to LAD.3.5 x 18mm Expedition. RCA dominant mild to moderate. Circ LM ok.    Surgical History: Past Surgical History:  Procedure Laterality Date   CORONARY ANGIOPLASTY WITH STENT PLACEMENT     OPERATIVE ULTRASOUND N/A 03/15/2018   Procedure: OPERATIVE ULTRASOUND;  Surgeon: Vanna Scotland, MD;   Location: [facility];  Service: Urology;  Laterality: N/A;   ROBOTIC ASSITED PARTIAL NEPHRECTOMY Left 03/15/2018   Procedure: ROBOTIC ASSITED PARTIAL NEPHRECTOMY;  Surgeon: Vanna Scotland, MD;  Location: [facility];  Service: Urology;  Laterality: Left;    Home Medications:  Allergies as of 05/20/2023   No Known Allergies      Medication List        Accurate as of May 20, 2023 11:59 PM. If you have any questions, ask your nurse or doctor.          aspirin EC 81 MG tablet Take 81 mg by mouth daily.   metoprolol succinate 25 MG 24 hr tablet Commonly known as: TOPROL-XL Take 25 mg by mouth every evening.   multivitamin with minerals Tabs tablet Take 1 tablet by mouth daily.   naphazoline-glycerin 0.012-0.2 % Soln Commonly known as: CLEAR EYES REDNESS Place 1 drop into both eyes 4 (four) times daily as needed for eye irritation.   naproxen sodium 220 MG tablet Commonly known as: ALEVE Take 220 mg by mouth daily as needed.   rosuvastatin 20 MG tablet Commonly known as: CRESTOR Take 20 mg by mouth daily.        Family History: Family History  Problem Relation Age of Onset   Kidney failure Mother     Social History:  reports that he has never smoked. He has never used smokeless tobacco. He reports current alcohol use. He reports that he does not use drugs.  Physical Exam: BP (!) 167/84   Pulse 71   Ht 5\' 6"  (1.676 m)   Wt 192 lb (87.1 kg)   BMI 30.99 kg/m   Constitutional:  Alert and oriented, No acute distress. HEENT: [place] AT, moist mucus membranes.  Trachea midline, no masses. Neurologic: Grossly intact, no focal deficits, moving all 4 extremities. Psychiatric: Normal mood and affect.   Pertinent Imaging: EXAM: MRI ABDOMEN WITHOUT AND WITH CONTRAST   TECHNIQUE: Multiplanar multisequence MR imaging of the abdomen was performed both before and after the administration of intravenous contrast.   CONTRAST:  8mL GADAVIST GADOBUTROL 1 MMOL/ML IV SOLN    COMPARISON:  CT abdomen pelvis, 04/15/2021   FINDINGS: Lower chest: No acute abnormality.   Hepatobiliary: No solid liver abnormality is seen. No gallstones, gallbladder wall thickening, or biliary dilatation.   Pancreas: Unremarkable. No pancreatic ductal dilatation or surrounding inflammatory changes.   Spleen: Normal in size without significant abnormality.   Adrenals/Urinary Tract: Adrenal glands are unremarkable. Unchanged postoperative findings of partial nephrectomy of the peripheral superior pole of the left kidney without associated suspicious soft tissue or contrast enhancement to suggest local recurrence. Unchanged poorly enhancing lesion of the posterior midportion of the left kidney measuring 1.5 x 1.1 cm (series 14, image 47). The right kidney is normal, without obvious renal calculi, solid lesion, or hydronephrosis.   Stomach/Bowel: Stomach is within normal limits. No evidence of bowel wall thickening, distention, or inflammatory changes.   Vascular/Lymphatic: No significant vascular findings are present. No enlarged abdominal lymph nodes.   Other: No abdominal wall hernia or abnormality. No ascites.   Musculoskeletal: No acute or significant osseous findings.   IMPRESSION: 1. Unchanged postoperative findings of partial nephrectomy of the peripheral superior pole of the left kidney without associated suspicious soft tissue or contrast enhancement to suggest local recurrence. 2. Unchanged poorly enhancing lesion of the posterior midportion of the left kidney measuring 1.5 x 1.1 cm. This remains suspicious for a small, very indolent papillary renal cell carcinoma. 3. No evidence of lymphadenopathy or metastatic disease in the abdomen.     Electronically Signed   By: Jearld Lesch M.D.   On: 05/08/2023 08:31   This was personally reviewed and I agree with the radiologic interpretation.   PROCEDURE: US RENAL   HISTORY: Patient is a 72 y/o M with  history of renal cell carcinoma. Follow-up interpolar lesion. Partial left nephrectomy.   COMPARISON: MR abdomen 05/04/2023, U/S renal 04/10/2022, CT abdomen 04/12/2021.   TECHNIQUE: Two-dimensional grayscale and color Doppler ultrasound of the kidneys was performed.   FINDINGS: The urinary bladder demonstrates normal anechoic echogenicity without wall thickening. The bilateral ureteral jets are visualized.   The prostate is enlarged demonstrating a volume of 155 mL.   The right kidney measures 11.5 cm. Renal cortical echotexture is normal. There is no hydronephrosis. There are no stones. There are no cysts.   The left kidney measures 10.2 cm. Renal cortical echotexture is normal. There are postsurgical changes anterior mid segment. There is no hydronephrosis. There are no stones. There are no cysts.   IMPRESSION: 1.  Prostatomegaly.   2.  No sonographic evidence of renal mass bilaterally.   Thank you for allowing [place] to assist in the care of this patient.     Electronically Signed   By: Lestine Box M.D.   On: 05/11/2023 12:20  This was personally reviewed and I agree with the radiologic interpretation.  Assessment & Plan:    1. Left renal lesion -  The lesion has remained stable in size (1.5 cm) over five years, suggesting a  very low likelihood of aggressive malignancy. The inability to visualize the lesion on ultrasound limits follow-up options to MRI. - Continue surveillance with MRI in 2 years to monitor for any changes. He agrees with this plan. - No immediate intervention is necessary nor if he notices any  Return in about 2 years (around 05/19/2025) for repeat MRI abdomen.  I have reviewed the above documentation for accuracy and completeness, and I agree with the above.   Vanna Scotland, MD  [facility] [address] [place], [place] [address] [phone]

## 2024-01-25 ENCOUNTER — Ambulatory Visit: Admitting: Certified Registered"

## 2024-01-25 ENCOUNTER — Encounter
Admission: RE | Disposition: A | Discharge status: Home / Self Care | Payer: Self-pay | Source: Home / Self Care | Attending: Gastroenterology

## 2024-01-25 ENCOUNTER — Ambulatory Visit
Admission: RE | Admit: 2024-01-25 | Discharge: 2024-01-25 | Disposition: A | Discharge status: Home / Self Care | Attending: Gastroenterology | Admitting: Gastroenterology

## 2024-01-25 DIAGNOSIS — K640 First degree hemorrhoids: Secondary | ICD-10-CM | POA: Insufficient documentation

## 2024-01-25 DIAGNOSIS — I2510 Atherosclerotic heart disease of native coronary artery without angina pectoris: Secondary | ICD-10-CM | POA: Insufficient documentation

## 2024-01-25 DIAGNOSIS — Z955 Presence of coronary angioplasty implant and graft: Secondary | ICD-10-CM | POA: Diagnosis not present

## 2024-01-25 DIAGNOSIS — Z79899 Other long term (current) drug therapy: Secondary | ICD-10-CM | POA: Diagnosis not present

## 2024-01-25 DIAGNOSIS — D123 Benign neoplasm of transverse colon: Secondary | ICD-10-CM | POA: Diagnosis not present

## 2024-01-25 DIAGNOSIS — Z1211 Encounter for screening for malignant neoplasm of colon: Secondary | ICD-10-CM | POA: Diagnosis present

## 2024-01-25 DIAGNOSIS — Z905 Acquired absence of kidney: Secondary | ICD-10-CM | POA: Insufficient documentation

## 2024-01-25 DIAGNOSIS — K219 Gastro-esophageal reflux disease without esophagitis: Secondary | ICD-10-CM | POA: Diagnosis not present

## 2024-01-25 DIAGNOSIS — I10 Essential (primary) hypertension: Secondary | ICD-10-CM | POA: Insufficient documentation

## 2024-01-25 DIAGNOSIS — Z7982 Long term (current) use of aspirin: Secondary | ICD-10-CM | POA: Diagnosis not present

## 2024-01-25 DIAGNOSIS — Z860101 Personal history of adenomatous and serrated colon polyps: Secondary | ICD-10-CM | POA: Insufficient documentation

## 2024-01-25 HISTORY — PX: COLONOSCOPY: SHX5424

## 2024-01-25 HISTORY — PX: POLYPECTOMY: SHX149

## 2024-01-25 SURGERY — COLONOSCOPY
Anesthesia: General

## 2024-01-25 MED ORDER — SODIUM CHLORIDE 0.9 % IV SOLN
INTRAVENOUS | Status: DC
  Administered 2024-01-25 (×2): 20 mL/h via INTRAVENOUS

## 2024-01-25 MED ORDER — STERILE WATER FOR IRRIGATION IR SOLN
Status: DC | PRN
  Administered 2024-01-25 (×2): 60 mL

## 2024-01-25 MED ORDER — PROPOFOL 500 MG/50ML IV EMUL
INTRAVENOUS | Status: DC | PRN
  Administered 2024-01-25 (×2): 150 ug/kg/min via INTRAVENOUS

## 2024-01-25 MED ORDER — PROPOFOL 10 MG/ML IV BOLUS
INTRAVENOUS | Status: DC | PRN
  Administered 2024-01-25 (×2): 70 mg via INTRAVENOUS

## 2024-01-25 MED ORDER — LIDOCAINE HCL (CARDIAC) PF 100 MG/5ML IV SOSY
PREFILLED_SYRINGE | INTRAVENOUS | Status: DC | PRN
  Administered 2024-01-25 (×2): 50 mg via INTRAVENOUS

## 2024-01-25 NOTE — H&P (Signed)
 Outpatient short stay form Pre-procedure 01/25/2024  Tanner ONEIDA Schick, MD  Primary Physician: [name], MD  Reason for visit:  Surveillance  History of present illness:    73 y/o gentleman with history of CAD, hypertension, and partial nephrectomy here for surveillance colonoscopy. Last colonoscopy 10 years ago with adenomatous polyp per clinic note. No blood thinners except aspirin. No family history of GI malignancies. No major abdominal surgeries besides partial nephrectomy.    Current Facility-Administered Medications:    0.9 %  sodium chloride  infusion, , Intravenous, Continuous, Sharnee Douglass, Tanner ONEIDA, MD, Last Rate: 20 mL/hr at 01/25/24 0842, 20 mL/hr at 01/25/24 0842  Medications Prior to Admission  Medication Sig Dispense Refill Last Dose/Taking   aspirin EC 81 MG tablet Take 81 mg by mouth daily.    Past Week   metoprolol  succinate (TOPROL -XL) 25 MG 24 hr tablet Take 25 mg by mouth every evening.  3 Past Week   Multiple Vitamin (MULTIVITAMIN WITH MINERALS) TABS tablet Take 1 tablet by mouth daily.   Past Week   naphazoline-glycerin  (CLEAR EYES REDNESS) 0.012-0.2 % SOLN Place 1 drop into both eyes 4 (four) times daily as needed for eye irritation.   Past Week   naproxen sodium (ALEVE) 220 MG tablet Take 220 mg by mouth daily as needed.   Past Week   rosuvastatin (CRESTOR) 20 MG tablet Take 20 mg by mouth daily.   Past Week     No Known Allergies   Past Medical History:  Diagnosis Date   Abnormal nuclear stress test 01/05/2018   6/13:DRH:distal lad ischemia, ef normal   Anemia 01/05/2018   Cancer (HCC)    Coronary artery disease    GERD (gastroesophageal reflux disease) 01/05/2018   Hemorrhoids 01/05/2018   History of PTCA 01/05/2018   6/13:DES to LAD.3.5 x 18mm Expedition. RCA dominant mild to moderate. Circ LM ok.   Hyperlipidemia 09/29/2012   Hypertension    Hypomagnesemia 01/05/2018   Right ureteral stone 01/05/2018   Status post percutaneous transluminal  coronary angioplasty 09/29/2012   6/13:DES to LAD.3.5 x 18mm Expedition. RCA dominant mild to moderate. Circ LM ok.    Review of systems:  Otherwise negative.    Physical Exam  Gen: Alert, oriented. Appears stated age.  HEENT: PERRLA. Lungs: No respiratory distress CV: RRR Abd: soft, benign, no masses Ext: No edema    Planned procedures: Proceed with colonoscopy. The patient understands the nature of the planned procedure, indications, risks, alternatives and potential complications including but not limited to bleeding, infection, perforation, damage to internal organs and possible oversedation/side effects from anesthesia. The patient agrees and gives consent to proceed.  Please refer to procedure notes for findings, recommendations and patient disposition/instructions.     Tanner ONEIDA Schick, MD [facility]

## 2024-01-25 NOTE — Anesthesia Preprocedure Evaluation (Signed)
 Anesthesia Evaluation  Patient identified by MRN, date of birth, ID band Patient awake    Reviewed: Allergy & Precautions, H&P , NPO status , Patient's Chart, lab work & pertinent test results, reviewed documented beta blocker date and time   Airway Mallampati: II   Neck ROM: full    Dental  (+) Poor Dentition   Pulmonary neg pulmonary ROS   Pulmonary exam normal        Cardiovascular Exercise Tolerance: Good hypertension, On Medications + CAD and + Cardiac Stents  Normal cardiovascular exam Rhythm:regular Rate:Normal     Neuro/Psych negative neurological ROS  negative psych ROS   GI/Hepatic Neg liver ROS,GERD  Medicated,,  Endo/Other  negative endocrine ROS    Renal/GU negative Renal ROS  negative genitourinary   Musculoskeletal   Abdominal   Peds  Hematology  (+) Blood dyscrasia, anemia   Anesthesia Other Findings Past Medical History: 01/05/2018: Abnormal nuclear stress test     Comment:  6/13:DRH:distal lad ischemia, ef normal 01/05/2018: Anemia No date: Cancer (HCC) No date: Coronary artery disease 01/05/2018: GERD (gastroesophageal reflux disease) 01/05/2018: Hemorrhoids 01/05/2018: History of PTCA     Comment:  6/13:DES to LAD.3.5 x 18mm Expedition. RCA dominant mild              to moderate. Circ LM ok. 09/29/2012: Hyperlipidemia No date: Hypertension 01/05/2018: Hypomagnesemia 01/05/2018: Right ureteral stone 09/29/2012: Status post percutaneous transluminal coronary angioplasty     Comment:  6/13:DES to LAD.3.5 x 18mm Expedition. RCA dominant mild              to moderate. Circ LM ok. Past Surgical History: No date: CORONARY ANGIOPLASTY WITH STENT PLACEMENT 03/15/2018: OPERATIVE ULTRASOUND; N/A     Comment:  Procedure: OPERATIVE ULTRASOUND;  Surgeon: Penne Knee, MD;  Location: [facility] OR[facility];  Service: Urology;                Laterality: N/A; 03/15/2018: ROBOTIC ASSITED PARTIAL  NEPHRECTOMY; Left     Comment:  Procedure: ROBOTIC ASSITED PARTIAL NEPHRECTOMY;                Surgeon: Penne Knee, MD;  Location: [facility];                Service: Urology;  Laterality: Left; BMI    Body Mass Index: 31.12 kg/m     Reproductive/Obstetrics negative OB ROS                              Anesthesia Physical Anesthesia Plan  ASA: 3  Anesthesia Plan: General   Post-op Pain Management:    Induction:   PONV Risk Score and Plan:   Airway Management Planned:   Additional Equipment:   Intra-op Plan:   Post-operative Plan:   Informed Consent: I have reviewed the patients History and Physical, chart, labs and discussed the procedure including the risks, benefits and alternatives for the proposed anesthesia with the patient or authorized representative who has indicated his/her understanding and acceptance.     Dental Advisory Given  Plan Discussed with: CRNA  Anesthesia Plan Comments:         Anesthesia Quick Evaluation

## 2024-01-25 NOTE — Interval H&P Note (Signed)
 History and Physical Interval Note:  01/25/2024 9:09 AM  Tanner Jackson  has presented today for surgery, with the diagnosis of hx of adenomatous polyp of colon.  The various methods of treatment have been discussed with the patient and family. After consideration of risks, benefits and other options for treatment, the patient has consented to  Procedure(s) with comments: COLONOSCOPY (N/A) - DM as a surgical intervention.  The patient's history has been reviewed, patient examined, no change in status, stable for surgery.  I have reviewed the patient's chart and labs.  Questions were answered to the patient's satisfaction.     Ole ONEIDA Schick  Ok to proceed with colonoscopy

## 2024-01-25 NOTE — Op Note (Signed)
 [facility] Patient Name: Tanner Jackson Procedure Date: 01/25/2024 8:58 AM MRN: [id number] Account #: [id number] Date of Birth: 09/19/1950 Admit Type: Outpatient Age: 73 Room: [facility] ENDO ROOM 3 Gender: Male Note Status: Finalized Instrument Name: Colon Scope [id number] Procedure:             Colonoscopy Indications:           Surveillance: Personal history of adenomatous polyps                         on last colonoscopy > 5 years ago Providers:             Ole Schick MD, MD Referring MD:          Theotis simas Medicines:             Monitored Anesthesia Care Complications:         No immediate complications. Estimated blood loss:                         Minimal. Procedure:             Pre-Anesthesia Assessment:                        - Prior to the procedure, a History and Physical was                         performed, and patient medications and allergies were                         reviewed. The patient is competent. The risks and                         benefits of the procedure and the sedation options and                         risks were discussed with the patient. All questions                         were answered and informed consent was obtained.                         Patient identification and proposed procedure were                         verified by the physician, the nurse, the                         anesthesiologist, the anesthetist and the technician                         in the endoscopy suite. Mental Status Examination:                         alert and oriented. Airway Examination: normal                         oropharyngeal airway and neck mobility. Respiratory  Examination: clear to auscultation. CV Examination:                         normal. Prophylactic Antibiotics: The patient does not                         require prophylactic antibiotics. Prior                         Anticoagulants:  The patient has taken no anticoagulant                         or antiplatelet agents. ASA Grade Assessment: III - A                         patient with severe systemic disease. After reviewing                         the risks and benefits, the patient was deemed in                         satisfactory condition to undergo the procedure. The                         anesthesia plan was to use monitored anesthesia care                         (MAC). Immediately prior to administration of                         medications, the patient was re-assessed for adequacy                         to receive sedatives. The heart rate, respiratory                         rate, oxygen saturations, blood pressure, adequacy of                         pulmonary ventilation, and response to care were                         monitored throughout the procedure. The physical                         status of the patient was re-assessed after the                         procedure.                        After obtaining informed consent, the colonoscope was                         passed under direct vision. Throughout the procedure,                         the patient's blood pressure, pulse, and oxygen  saturations were monitored continuously. The                         Colonoscope was introduced through the anus and                         advanced to the the cecum, identified by appendiceal                         orifice and ileocecal valve. The colonoscopy was                         performed without difficulty. The patient tolerated                         the procedure well. The quality of the bowel                         preparation was good. The ileocecal valve, appendiceal                         orifice, and rectum were photographed. Findings:      The perianal and digital rectal examinations were normal.      A 2 mm polyp was found in the transverse colon. The polyp was  sessile.       The polyp was removed with a cold snare. Resection and retrieval were       complete. Estimated blood loss was minimal.      Internal hemorrhoids were found during retroflexion. The hemorrhoids       were Grade I (internal hemorrhoids that do not prolapse).      The exam was otherwise without abnormality on direct and retroflexion       views. Impression:            - One 2 mm polyp in the transverse colon, removed with                         a cold snare. Resected and retrieved.                        - Internal hemorrhoids.                        - The examination was otherwise normal on direct and                         retroflexion views. Recommendation:        - Discharge patient to home.                        - Resume previous diet.                        - Await pathology results.                        - Repeat colonoscopy is not recommended due to current                         age (60 years or older)  for surveillance.                        - Return to referring physician as previously                         scheduled. Procedure Code(s):     --- Professional ---                        [phone], Colonoscopy, flexible; with removal of                         tumor(s), polyp(s), or other lesion(s) by snare                         technique Diagnosis Code(s):     --- Professional ---                        Z86.010, Personal history of colonic polyps                        D12.3, Benign neoplasm of transverse colon (hepatic                         flexure or splenic flexure)                        K64.0, First degree hemorrhoids CPT copyright 2022 [employer]. All rights reserved. The codes documented in this report are preliminary and upon coder review may  be revised to meet current compliance requirements. Ole Schick MD, MD 01/25/2024 9:34:19 AM Number of Addenda: 0 Note Initiated On: 01/25/2024 8:58 AM Scope Withdrawal Time: 0 hours 8  minutes 33 seconds  Total Procedure Duration: 0 hours 12 minutes 50 seconds  Estimated Blood Loss:  Estimated blood loss was minimal.      [facility]

## 2024-01-25 NOTE — Transfer of Care (Signed)
 Immediate Anesthesia Transfer of Care Note  Patient: Tanner Jackson  Procedure(s) Performed: COLONOSCOPY POLYPECTOMY, INTESTINE  Patient Location: Endoscopy Unit  Anesthesia Type:General  Level of Consciousness: drowsy and patient cooperative  Airway & Oxygen Therapy: Patient Spontanous Breathing and Patient connected to nasal cannula oxygen  Post-op Assessment: Report given to RN, Post -op Vital signs reviewed and stable, and Patient moving all extremities X 4  Post vital signs: Reviewed and stable  Last Vitals:  Vitals Value Taken Time  BP 87/65 01/25/24 09:31  Temp 36.2 C 01/25/24 09:30  Pulse 64 01/25/24 09:31  Resp 12 01/25/24 09:31  SpO2 100 % 01/25/24 09:31  Vitals shown include unfiled device data.  Last Pain:  Vitals:   01/25/24 0930  TempSrc: Temporal  PainSc: Asleep         Complications: No notable events documented.

## 2024-01-26 ENCOUNTER — Encounter: Payer: Self-pay | Admitting: Gastroenterology

## 2024-01-26 LAB — SURGICAL PATHOLOGY

## 2024-02-10 NOTE — Anesthesia Postprocedure Evaluation (Signed)
 Anesthesia Post Note  Patient: RUCKER PRIDGEON  Procedure(s) Performed: COLONOSCOPY POLYPECTOMY, INTESTINE  Patient location during evaluation: PACU Anesthesia Type: General Level of consciousness: awake and alert Pain management: pain level controlled Vital Signs Assessment: post-procedure vital signs reviewed and stable Respiratory status: spontaneous breathing, nonlabored ventilation, respiratory function stable and patient connected to nasal cannula oxygen Cardiovascular status: blood pressure returned to baseline and stable Postop Assessment: no apparent nausea or vomiting Anesthetic complications: no   No notable events documented.   Last Vitals:  Vitals:   01/25/24 0940 01/25/24 0947  BP: 105/77 120/72  Pulse: 61 62  Resp: 13 16  Temp: (!) 36.2 C (!) 36.2 C  SpO2: 99% 99%    Last Pain:  Vitals:   01/26/24 0750  TempSrc:   PainSc: 0-No pain                 Lynwood KANDICE Clause

## 2024-07-21 DIAGNOSIS — C642 Malignant neoplasm of left kidney, except renal pelvis: Secondary | ICD-10-CM | POA: Insufficient documentation

## 2024-07-21 DIAGNOSIS — Z125 Encounter for screening for malignant neoplasm of prostate: Secondary | ICD-10-CM | POA: Insufficient documentation

## 2024-07-21 NOTE — Progress Notes (Unsigned)
" ° °  07/27/2024 7:06 AM   [place] LITTIE Kitty [birth year] [id number]  Reason for visit: Follow up RCC   HPI: 74 y.o. male, initial follow up with me today, previously seen by Dr. Penne in Dec 2024  Prior HPI: Hx of Left RCC  - s/p robotic partial Nx (sept 2019)  - pT1a pap type 1 RCC, -sm  - incidental 1.4 cm indeterminate lesion on the mid posterior left kidney.  This was not treated at the time of partial nephrectomy.     Physical Exam: There were no vitals taken for this visit.   Constitutional:  Alert and oriented, No acute distress.  Laboratory Data: Creatinine 0.7 - 1.3 mg/dL 1.2   Component Ref Range & Units 7 d ago  PSA (Prostate Specific Antigen), Total 0.10 - 4.00 ng/mL 4.44 High    Component Ref Range & Units 6 yr ago  PSA 0.10 - 4.00 ng/dL 6.83   Pertinent Imaging: N/A    Assessment & Plan:    Encounter for screening prostate specific antigen (PSA) measurement Assessment & Plan: PSA 4.4 (Jan 2026)   - from 3.16 (2019)   Reviewed his most recent PSA data.  This is largely stable from value in 2019, with expected increase due to age and BPH.  His most recent value also falls within age-adjusted norms.  - DRE   Renal cell cancer, left (HCC) Assessment & Plan: s/p robotic partial Nx (sept 2019)  - pT1a pap type 1 RCC, -sm  - incidental 1.4 cm indeterminate lesion on the mid posterior left kidney.  This was not treated at the time of partial nephrectomy.   Surveillance US  + MRI have been NED to date. Incidental mid pole lesion also stable.   - due for interval MRI        Penne JONELLE Skye, MD  [facility] [address] [place], [place] [address] [phone] "

## 2024-07-21 NOTE — Assessment & Plan Note (Signed)
 PSA 4.4 (Jan 2026)   - from 3.16 (2019)   Reviewed his most recent PSA data.  This is largely stable from value in 2019, with expected increase due to age and BPH.  His most recent value also falls within age-adjusted norms.  - DRE

## 2024-07-21 NOTE — Assessment & Plan Note (Signed)
 s/p robotic partial Nx (sept 2019)  - pT1a pap type 1 RCC, -sm  - incidental 1.4 cm indeterminate lesion on the mid posterior left kidney.  This was not treated at the time of partial nephrectomy.   Surveillance US  + MRI have been NED to date. Incidental mid pole lesion also stable.   - due for interval MRI

## 2024-07-27 ENCOUNTER — Ambulatory Visit: Admitting: Urology

## 2024-07-27 DIAGNOSIS — Z125 Encounter for screening for malignant neoplasm of prostate: Secondary | ICD-10-CM

## 2024-07-27 DIAGNOSIS — C642 Malignant neoplasm of left kidney, except renal pelvis: Secondary | ICD-10-CM
